# Patient Record
Sex: Female | Born: 1980 | Race: White | Hispanic: No | Marital: Married | Smoking: Current every day smoker
Health system: Southern US, Community
[De-identification: ages and names within clinical notes are randomized; demographics above are authoritative.]

## PROBLEM LIST (undated history)

## (undated) DIAGNOSIS — G40909 Epilepsy, unspecified, not intractable, without status epilepticus: Secondary | ICD-10-CM

## (undated) DIAGNOSIS — E079 Disorder of thyroid, unspecified: Secondary | ICD-10-CM

## (undated) HISTORY — PX: TUBAL LIGATION: SHX77

## (undated) HISTORY — DX: Disorder of thyroid, unspecified: E07.9

## (undated) HISTORY — DX: Epilepsy, unspecified, not intractable, without status epilepticus: G40.909

---
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
## 2001-07-01 ENCOUNTER — Emergency Department (HOSPITAL_COMMUNITY): Admission: EM | Admit: 2001-07-01 | Discharge: 2001-07-01 | Payer: Self-pay | Admitting: Emergency Medicine

## 2001-08-18 ENCOUNTER — Other Ambulatory Visit: Admission: RE | Admit: 2001-08-18 | Discharge: 2001-08-18 | Payer: Self-pay | Admitting: Obstetrics & Gynecology

## 2002-03-12 ENCOUNTER — Inpatient Hospital Stay (HOSPITAL_COMMUNITY): Admission: AD | Admit: 2002-03-12 | Discharge: 2002-03-16 | Payer: Self-pay | Admitting: Obstetrics and Gynecology

## 2002-08-02 ENCOUNTER — Emergency Department (HOSPITAL_COMMUNITY): Admission: EM | Admit: 2002-08-02 | Discharge: 2002-08-02 | Payer: Self-pay | Admitting: Emergency Medicine

## 2002-10-12 ENCOUNTER — Emergency Department (HOSPITAL_COMMUNITY): Admission: EM | Admit: 2002-10-12 | Discharge: 2002-10-12 | Payer: Self-pay | Admitting: Emergency Medicine

## 2003-01-31 ENCOUNTER — Other Ambulatory Visit: Admission: RE | Admit: 2003-01-31 | Discharge: 2003-01-31 | Payer: Self-pay | Admitting: Obstetrics and Gynecology

## 2003-07-12 ENCOUNTER — Ambulatory Visit (HOSPITAL_COMMUNITY): Admission: RE | Admit: 2003-07-12 | Discharge: 2003-07-12 | Payer: Self-pay | Admitting: Neurology

## 2004-02-17 ENCOUNTER — Other Ambulatory Visit: Admission: RE | Admit: 2004-02-17 | Discharge: 2004-02-17 | Payer: Self-pay | Admitting: Obstetrics and Gynecology

## 2004-09-28 ENCOUNTER — Inpatient Hospital Stay (HOSPITAL_COMMUNITY): Admission: AD | Admit: 2004-09-28 | Discharge: 2004-09-30 | Payer: Self-pay | Admitting: Obstetrics and Gynecology

## 2010-08-30 ENCOUNTER — Other Ambulatory Visit: Payer: Self-pay | Admitting: Family Medicine

## 2010-08-30 DIAGNOSIS — E041 Nontoxic single thyroid nodule: Secondary | ICD-10-CM

## 2010-09-11 ENCOUNTER — Other Ambulatory Visit (HOSPITAL_COMMUNITY)
Admission: RE | Admit: 2010-09-11 | Discharge: 2010-09-11 | Disposition: A | Discharge status: Home / Self Care | Payer: Self-pay | Source: Ambulatory Visit | Attending: Interventional Radiology | Admitting: Interventional Radiology

## 2010-09-11 ENCOUNTER — Ambulatory Visit
Admission: RE | Admit: 2010-09-11 | Discharge: 2010-09-11 | Disposition: A | Discharge status: Home / Self Care | Payer: No Typology Code available for payment source | Source: Ambulatory Visit | Attending: Family Medicine | Admitting: Family Medicine

## 2010-09-11 ENCOUNTER — Other Ambulatory Visit: Payer: Self-pay | Admitting: Interventional Radiology

## 2010-09-11 DIAGNOSIS — E041 Nontoxic single thyroid nodule: Secondary | ICD-10-CM

## 2010-09-11 DIAGNOSIS — E049 Nontoxic goiter, unspecified: Secondary | ICD-10-CM | POA: Insufficient documentation

## 2010-09-21 ENCOUNTER — Ambulatory Visit: Payer: Self-pay | Admitting: Endocrinology

## 2010-10-08 ENCOUNTER — Ambulatory Visit: Payer: Self-pay | Admitting: Endocrinology

## 2010-10-30 ENCOUNTER — Encounter: Payer: Self-pay | Admitting: Endocrinology

## 2010-10-30 ENCOUNTER — Ambulatory Visit (INDEPENDENT_AMBULATORY_CARE_PROVIDER_SITE_OTHER): Payer: Self-pay | Admitting: Endocrinology

## 2010-10-30 DIAGNOSIS — E042 Nontoxic multinodular goiter: Secondary | ICD-10-CM

## 2010-10-30 DIAGNOSIS — G40909 Epilepsy, unspecified, not intractable, without status epilepticus: Secondary | ICD-10-CM

## 2010-10-30 NOTE — Patient Instructions (Signed)
most of the time, a "lumpy thyroid" will eventually become overactive.  this is usually a slow process, happening over the span of many years. Please return here in approx 6 months.

## 2010-10-30 NOTE — Progress Notes (Signed)
  Subjective:    Patient ID: Caitlyn Gibson, female    DOB: 01-Sep-1980, 30 y.o.   MRN: [id number]  HPI Pt recently saw dr love for epilepsy.  He noted a goiter.  Pt had an ultrasound in feb, and then another in march, with bx then.  Pt says she notices the goiter, but it does not bother her. Past Medical History  Diagnosis Date  . Epilepsy    Past Surgical History  Procedure Date  . Appendectomy 1998    reports that she has been smoking.  She does not have any smokeless tobacco history on file. She reports that she does not drink alcohol or use illicit drugs. 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  Mother had resection of a goiter (benign). Allergies  Allergen Reactions  . Azithromycin     Review of Systems Denies sob and dysphagia.    Objective:   Physical Exam GENERAL: no distress head: no deformity eyes: no periorbital swelling, no proptosis external nose and ears are normal Neck:  The thyroid is 5x normal size on the right lobe, with a multinodular surface.  The left lobe is normal, except for a 2 cm lower pole nodule.     Labs: i reviewed the ultrasound and bx results.   Assessment & Plan:  Multinodular goiter, usually hereditary.  Although the [place] suggested growth, malignancy is very unlikely (fhx pos for benign goiter) Epilepsy.  As tsh is said to be normal, this is not related to #1.

## 2010-10-31 DIAGNOSIS — E042 Nontoxic multinodular goiter: Secondary | ICD-10-CM | POA: Insufficient documentation

## 2010-10-31 DIAGNOSIS — G40909 Epilepsy, unspecified, not intractable, without status epilepticus: Secondary | ICD-10-CM | POA: Insufficient documentation

## 2010-11-13 ENCOUNTER — Encounter: Payer: Self-pay | Admitting: Endocrinology

## 2012-09-29 ENCOUNTER — Telehealth: Payer: Self-pay | Admitting: *Deleted

## 2012-09-29 NOTE — Telephone Encounter (Signed)
200mg , 2am, 1 noon, 2pm, 150 tablet monthly supply.

## 2012-09-30 MED ORDER — CARBAMAZEPINE 200 MG PO TABS: 200.0000 mg | ORAL_TABLET | ORAL | Status: DC

## 2012-09-30 NOTE — Telephone Encounter (Signed)
I was out of the office yesterday.  Will send Rx now.  Former Love patient.  Dr Eulah Citizen

## 2012-10-15 ENCOUNTER — Ambulatory Visit (INDEPENDENT_AMBULATORY_CARE_PROVIDER_SITE_OTHER): Payer: Self-pay | Admitting: Neurology

## 2012-10-15 ENCOUNTER — Encounter: Payer: Self-pay | Admitting: Neurology

## 2012-10-15 VITALS — BP 98/61 | HR 69 | Ht 63.0 in | Wt 131.0 lb

## 2012-10-15 DIAGNOSIS — E079 Disorder of thyroid, unspecified: Secondary | ICD-10-CM

## 2012-10-15 MED ORDER — CARBAMAZEPINE 200 MG PO TABS: 200.0000 mg | ORAL_TABLET | ORAL | Status: DC

## 2012-10-16 NOTE — Progress Notes (Signed)
HPI:  Caitlyn Gibson is a 32- year-old right-handed white separated female with a history of simple partial seizures and secondarily generalization, previous patient of Dr. Sandria Manly, last visit was in May 2013.   Her seizure began in December of 1997.  She was first seen at age 32, 06/09/1996. She was the product of an 8 month pregnancy complicated by amniocentesis at 4 months of gestation. A chromosome analysis was unremarkable. At 6-1/2 months gestation there  was leaking from the placenta and she was born in hospital at  8 months gestation with a vertex presentation. She was early in motor developmental milestones. She did well in school, making A's and B's and never failed a grade. Her first seizure was a generalized major motor seizure 06/09/1996.   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 EEG 06/21/1996 showed  left temporal sharp waves with phase reversals at the T3 electrode. She was placed on Tegretol after a second generalized major motor seizure 06/22/1996 .She has not had a generalized major motor seizure since 1997.   Her last MRI study of the brain 07/12/2003 with and without contrast enhancement was normal.  In 2000 she quit taking her medicines correctly but then restarted after I discussed this with her. She had two successful pregnancies on carbamazepine or Tegretol. She had a tubal ligation  09/2004. I have asked her to take vitamin D and calcium but she never has. She has never had a DEXA scan because of costs. She has no insurance.   She has auras of seizures and simple partial seizures that occured once per month. She says that her head feels "crazy" and that she has an "empty" feeling in her  head. She hears a ticking sound that  goes away in 2 minutes. There is no dj vu, strange odors,or tastes.  She states that she takes her medication correctly. She is on carbamazepine 200 mg tablets, 2 twice daily and 100 mg at noon. Last level was 8.3 She still has those aura 1-2 times q 6 months.  She had a thyroid mass on exam and I referred her to Dr. [name].She underwent a thyroid ultrasound  that showed bilateral calcifications and left thyroid nodule biopsy, showing a goiter. She was to see Dr. [name],  She is on hold of her thyroid disease evaluation because of financial reasons,  Review of Systems  Enlarged thyroid PHYSICAL EXAMINATOINS:  Generalized: In no acute distress  Neck: Supple, no carotid bruits, enlarged firm thyroid  Cardiac: Regular rate rhythm  Pulmonary: Clear to auscultation bilaterally  Musculoskeletal: No deformity  Neurological examination  Mentation: Alert oriented to time, place, history taking, and causual conversation, depressed looking  Cranial nerve II-XII: Pupils were equal round reactive to light extraocular movements were full, visual field were full on confrontational test. facial sensation and strength were normal. hearing was intact to finger rubbing bilaterally. Uvula tongue midline.  head turning and shoulder shrug and were normal and symmetric.Tongue protrusion into cheek strength was normal.  Motor: normal tone, bulk and strength.  Sensory: Intact to fine touch, pinprick, preserved vibratory sensation, and proprioception at toes.  Coordination: Normal finger to nose, heel-to-shin bilaterally there was no truncal ataxia  Gait: Rising  up from seated position without assistance, normal stance, without trunk ataxia, moderate stride, good arm swing, smooth turning, able to perform tiptoe, and heel walking without difficulty.   Romberg signs: Negative  Deep tendon reflexes: Brachioradialis 2/2, biceps 2/2, triceps 2/2, patellar 2/2,  Achilles 2/2, plantar responses were flexor bilaterally.  Assessment and plan;  32 years old Caucasian female, with history of complex partial seizure  1 I refilled her medication, carbamazepine 200 mg 2 in the morning, 1 at noon, 2 at night,   because of financial reasons, she wants to hold off laboratory evaluation this time,  2. , return to clinic in one year

## 2012-11-20 ENCOUNTER — Telehealth: Payer: Self-pay | Admitting: *Deleted

## 2012-11-20 NOTE — Telephone Encounter (Signed)
Caitlyn Gibson called asking about refilling medication from one brand generic to another that pt was on originally (Epitol).  She had been taking the epitol then they were not able to get and changed to a taro brand.  This was for 1-2 months.  Now the epitol is back.   I gave the ok for this.  No change in dosing.  Contact is Caitlyn Gibson  [phone] at the [facility], [address] in [place], [place].

## 2012-11-20 NOTE — Telephone Encounter (Signed)
Agree above. 

## 2013-04-21 DIAGNOSIS — Z0289 Encounter for other administrative examinations: Secondary | ICD-10-CM

## 2013-05-05 ENCOUNTER — Telehealth: Payer: Self-pay

## 2013-05-05 NOTE — Telephone Encounter (Signed)
I called and left patient a VM that I am faxing out the [facility] forms now. I will also send her a copy.

## 2013-06-04 ENCOUNTER — Emergency Department: Payer: Self-pay | Admitting: Emergency Medicine

## 2013-08-04 ENCOUNTER — Telehealth: Payer: Self-pay | Admitting: Neurology

## 2013-08-04 ENCOUNTER — Encounter: Payer: Self-pay | Admitting: Neurology

## 2013-08-04 NOTE — Telephone Encounter (Signed)
Left message for patient about rescheduling 10/19/13 to 11/02/13 per Dr. Zannie CoveYan's schedule, printed and sent letter.

## 2013-10-19 ENCOUNTER — Other Ambulatory Visit: Payer: Self-pay | Admitting: Neurology

## 2013-10-19 ENCOUNTER — Ambulatory Visit: Payer: Self-pay | Admitting: Neurology

## 2013-10-19 MED ORDER — CARBAMAZEPINE 200 MG PO TABS: ORAL_TABLET | ORAL | Status: DC

## 2013-11-02 ENCOUNTER — Ambulatory Visit (INDEPENDENT_AMBULATORY_CARE_PROVIDER_SITE_OTHER): Payer: Self-pay | Admitting: Neurology

## 2013-11-02 ENCOUNTER — Telehealth: Payer: Self-pay | Admitting: Neurology

## 2013-11-02 ENCOUNTER — Encounter (INDEPENDENT_AMBULATORY_CARE_PROVIDER_SITE_OTHER): Payer: Self-pay

## 2013-11-02 ENCOUNTER — Encounter: Payer: Self-pay | Admitting: Neurology

## 2013-11-02 VITALS — BP 117/76 | HR 90 | Ht 63.0 in | Wt 120.0 lb

## 2013-11-02 DIAGNOSIS — E079 Disorder of thyroid, unspecified: Secondary | ICD-10-CM

## 2013-11-02 DIAGNOSIS — G40909 Epilepsy, unspecified, not intractable, without status epilepticus: Secondary | ICD-10-CM

## 2013-11-02 DIAGNOSIS — E042 Nontoxic multinodular goiter: Secondary | ICD-10-CM

## 2013-11-02 MED ORDER — CARBAMAZEPINE 200 MG PO TABS: ORAL_TABLET | ORAL | Status: DC

## 2013-11-02 NOTE — Telephone Encounter (Signed)
I did some searching, and [facility] [address] in [place] can fill a 30 day supply for $11.31.  I called the patient to see if she was agreeable to getting her Rx at [facility].  She said she would gladly go there instead of [facility] due to the price.  Rx has been resent to [facility].

## 2013-11-02 NOTE — Telephone Encounter (Signed)
[name]:  Please helped Caitlyn Gibson to see if she can get Tegretol from pharmaceutical assistance program, used to be a forethought prescription, but is no longer on 4 dollar list, she is self-pay

## 2013-11-02 NOTE — Progress Notes (Signed)
HPI:   Caitlyn Gibson is a 33 - year-old right-handed white separated female with a history of simple partial seizures and secondarily generalization, previous patient of Dr. Sandria ManlyLove, last visit was in May 2013.   Her seizure began in December of 1997.  She was first seen at age 89, 06/09/1996. She was the product of an 8 month pregnancy complicated by amniocentesis at 4 months of gestation. A chromosome analysis was unremarkable. At 6-1/2 months gestation there  was leaking from the placenta and she was born in hospital at  8 months gestation with a vertex presentation. She was early in motor developmental milestones. She did well in school, making A's and B's and never failed a grade. Her first seizure was a generalized major motor seizure 06/09/1996.   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 EEG 06/21/1996 showed  left temporal sharp waves with phase reversals at the T3 electrode. She was placed on Tegretol was started after a second generalized major motor seizure 06/22/1996 .She has not had a generalized major motor seizure since 1997.   Her last MRI study of the brain 07/12/2003 with and without contrast enhancement was normal.  She had two successful pregnancies on carbamazepine or Tegretol. She had a tubal ligation  09/2004. I have asked her to take vitamin D and calcium but she never has. She has never had a DEXA scan because of costs. She has no insurance.   She has auras of seizures and simple partial seizures that occured once per month. She says that her head feels "crazy" and that she has an "empty" feeling in her head. She hears a ticking sound that  goes away in 2 minutes. There is no dj vu,  strange odors,or tastes.  She is on carbamazepine 200 mg tablets, 2 twice daily and 100 mg at noon. Last level was 8.3 She still has those aura 1-2 times q 6 months.  She had a thyroid mass on exam and was referred t oDr. Little.She underwent a thyroid ultrasound  that showed bilateral calcifications and left thyroid nodule biopsy, showing a goiter. She was to see Dr. Romero BellingSean  Ellison,  She is on hold of her thyroid disease evaluation because of financial reasons,  UPDATE April 28th 2015: She still aura, few  times each month, around her period, she heard tickling sound, lasting for few minutes, no LOC. But she has not hadngeneralized seizure since age 33, she is having trouble affording her Tegretol now, because is no longer on 4 dollar prescription list   Review of Systems  Enlarged thyroid  PHYSICAL EXAMINATOINS:  Generalized: In no acute distress  Neck: Supple, no carotid bruits, enlarged firm thyroid  Cardiac: Regular rate rhythm  Pulmonary: Clear to auscultation bilaterally  Musculoskeletal: No deformity  Neurological examination  Mentation: Alert oriented to time, place, history taking, and causual conversation, depressed looking  Cranial nerve II-XII: Pupils were equal round reactive to light extraocular movements were full, visual field were full on confrontational test. facial sensation and strength were normal. hearing was intact to finger rubbing bilaterally. Uvula tongue midline.  head turning and shoulder shrug and were normal and symmetric.Tongue protrusion into cheek strength was normal.  Motor: normal tone, bulk  and strength.  Sensory: Intact to fine touch, pinprick, preserved vibratory sensation, and proprioception at toes.  Coordination: Normal finger to nose, heel-to-shin bilaterally there was no truncal ataxia  Gait: Rising up from seated position without assistance, normal stance, without trunk ataxia, moderate stride, good arm swing, smooth turning, able to  perform tiptoe, and heel walking without difficulty.   Romberg signs: Negative  Deep tendon reflexes: Brachioradialis 2/2, biceps 2/2, triceps 2/2, patellar 2/2, Achilles 2/2, plantar responses were flexor bilaterally.  Assessment and plan;  33 years old Caucasian female, with history of complex partial seizure  1 I refilled her medication, carbamazepine 200 mg 2 in the morning,  2 at night,  Will try to help her get medicine assistance program, return to clinic in one year refill her medications

## 2014-05-08 HISTORY — PX: OTHER SURGICAL HISTORY: SHX169

## 2014-05-30 ENCOUNTER — Emergency Department: Payer: Self-pay | Admitting: Emergency Medicine

## 2014-06-01 ENCOUNTER — Encounter: Payer: Self-pay | Admitting: Neurology

## 2014-06-19 DIAGNOSIS — G8929 Other chronic pain: Secondary | ICD-10-CM | POA: Insufficient documentation

## 2014-06-24 DIAGNOSIS — M719 Bursopathy, unspecified: Secondary | ICD-10-CM

## 2014-06-24 DIAGNOSIS — M67919 Unspecified disorder of synovium and tendon, unspecified shoulder: Secondary | ICD-10-CM | POA: Insufficient documentation

## 2014-11-03 ENCOUNTER — Encounter: Payer: Self-pay | Admitting: Nurse Practitioner

## 2014-11-03 ENCOUNTER — Ambulatory Visit (INDEPENDENT_AMBULATORY_CARE_PROVIDER_SITE_OTHER): Payer: Self-pay | Admitting: Nurse Practitioner

## 2014-11-03 ENCOUNTER — Ambulatory Visit: Payer: Self-pay | Admitting: Nurse Practitioner

## 2014-11-03 VITALS — BP 111/60 | HR 79 | Ht 64.5 in | Wt 134.6 lb

## 2014-11-03 DIAGNOSIS — G40909 Epilepsy, unspecified, not intractable, without status epilepticus: Secondary | ICD-10-CM

## 2014-11-03 DIAGNOSIS — N390 Urinary tract infection, site not specified: Secondary | ICD-10-CM | POA: Insufficient documentation

## 2014-11-03 MED ORDER — CARBAMAZEPINE 200 MG PO TABS: ORAL_TABLET | ORAL | Status: DC

## 2014-11-03 NOTE — Patient Instructions (Signed)
Continue carbamazepine 200 in the morning and 200 at night, will refill for one year Given co pay card Follow-up yearly and when necessary

## 2014-11-03 NOTE — Progress Notes (Signed)
[facility]  PATIENT: Caitlyn Gibson DOB: 1981/03/16   REASON FOR VISIT: Follow-up for seizure disorder  HISTORY FROM: Patient    HISTORY OF PRESENT ILLNESS:Caitlyn Gibson is a 34- year-old right-handed  female with a history of simple partial seizures and secondarily generalization, previous patient of Dr. Sandria Manly, last visit was 11/02/13 with Dr. Terrace Arabia. She still has aura, few times each month, around her period, she heard tickling sound, lasting for few minutes, no LOC. But she has not had a generalized seizure since age 64, she continues taking carbamazepine 200 mg 2 in the morning and 2 at night. She has no medical insurance or drug coverage. She returns for reevaluation  HISTORY: Her seizure began in December of 1997. She was first seen at age 67, 06/09/1996. She was the product of an 8 month pregnancy complicated by amniocentesis at 4 months of gestation. A chromosome analysis was unremarkable. At 6-1/2 months gestation there was leaking from the placenta and she was born in hospital at 8 months gestation with a vertex presentation. She was early in motor developmental milestones. She did well in school, making A's and B's and never failed a grade. Her first seizure was a generalized major motor seizure 06/09/1996.  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 EEG 06/21/1996 showed left temporal sharp waves with phase reversals at the T3 electrode. She was placed on Tegretol was started after a second generalized major motor seizure 06/22/1996 .She has not had a generalized major motor seizure since 1997.  Her last MRI study of the brain 07/12/2003 with and without contrast enhancement  was normal. She had two successful pregnancies on carbamazepine or Tegretol. She had a tubal ligation 09/2004. I have asked her to take vitamin D and calcium but she never has. She has never had a DEXA scan because of costs. She has no insurance.  She has auras of seizures and simple partial seizures that occured once per month. She says that her head feels "crazy" and that she has an "empty" feeling in her head. She hears a ticking sound that goes away in 2 minutes. There is no dj vu, strange odors,or tastes. She is on carbamazepine 200 mg tablets, 2 twice daily and 100 mg at noon. Last level was 8.3 She still has those aura 1-2 times q 6 months. She had a thyroid mass on exam and was referred t oDr. Little.She underwent a thyroid ultrasound that showed bilateral calcifications and left thyroid nodule biopsy, showing a goiter. She was to see Dr. [name], She is on hold of her thyroid disease evaluation because of financial reasons,    REVIEW OF SYSTEMS: Full 14 system review of systems performed and notable only for those listed, all others are neg:  Constitutional: neg  Cardiovascular: neg Ear/Nose/Throat: neg  Skin: neg Eyes: neg Respiratory: neg Gastroitestinal: neg  Hematology/Lymphatic: neg  Endocrine: Goiter Musculoskeletal: Torn rotator cuff Allergy/Immunology: neg Neurological: Headache Psychiatric: neg Sleep : neg   ALLERGIES: Allergies  Allergen Reactions  . Azithromycin   . Erythromycin     Cannot take with tegretol    HOME MEDICATIONS: Outpatient Prescriptions Prior to Visit  Medication Sig Dispense Refill  . carbamazepine (TEGRETOL) 200 MG tablet  Take 2 tabs twice a day 120 tablet 12   No facility-administered medications prior to visit.    PAST MEDICAL HISTORY: Past Medical History  Diagnosis Date  . Epilepsy   . Thyroid mass     PAST SURGICAL HISTORY: Past Surgical History  Procedure Laterality Date  . Appendectomy  1998  . Tubal ligation     . Rotator cuff tear Left 05/2014    ongoing for year before    FAMILY HISTORY: Family History  Problem Relation Age of Onset  . Cancer Other   . Cancer Other     Ovarian Cancer-Grandmother  . Diabetes Maternal Uncle     SOCIAL HISTORY: History   Social History  . Marital Status: Married    Spouse Name: Sam  . Number of Children: 2  . Years of Education: N/A   Occupational History  . Waitress    Social History Main Topics  . Smoking status: Current Every Day Smoker -- 1.00 packs/day    Types: Cigarettes  . Smokeless tobacco: Never Used  . Alcohol Use: No  . Drug Use: No  . Sexual Activity: Not on file   Other Topics Concern  . Not on file   Social History Narrative   Pt does not get regular exercise.   Lives with husband, 2 kids           PHYSICAL EXAM  Filed Vitals:   11/03/14 0918  BP: 111/60  Pulse: 79  Height: 5' 4.5" (1.638 m)  Weight: 134 lb 9.6 oz (61.054 kg)   Body mass index is 22.76 kg/(m^2). Generalized: In no acute distress Neck: Supple, no carotid bruits, enlarged firm thyroid Musculoskeletal: No deformity, decreased range of motion to left shoulder  Neurological examination  Mentation: Alert oriented to time, place, history taking, and causual conversation,  Cranial nerve II-XII: Pupils were equal round reactive to light extraocular movements were full, visual field were full on confrontational test. facial sensation and strength were normal. hearing was intact to finger rubbing bilaterally. Uvula tongue midline. head turning and shoulder shrug and were normal and symmetric.Tongue protrusion into cheek strength was normal. Motor: normal tone, bulk and strength. Coordination: Normal finger to nose, heel-to-shin bilaterally  Gait: Rising up from seated position without assistance, normal stance, without trunk ataxia, moderate stride, good arm swing, smooth turning, able to perform tiptoe, and heel walking without difficulty.  Deep  tendon reflexes: Brachioradialis 2/2, biceps 2/2, triceps 2/2, patellar 2/2, Achilles 2/2, plantar responses were flexor bilaterally.  DIAGNOSTIC DATA (LABS, IMAGING, TESTING) - ASSESSMENT AND PLAN  34 y.o. year old female  has a past medical history of Epilepsy and Thyroid mass. here to follow-up.  Continue carbamazepine 200 in the morning and 200 at night, will refill for one year Given co pay card, patient has no insurance Follow-up yearly and when necessary Nilda RiggsNancy Carolyn Eaden Hettinger, [facility] LLCGNP, [facility] Healthcare[facility], APRN  [facility] [address], [address] [place], [place] [address] [phone]

## 2014-11-04 NOTE — Progress Notes (Signed)
I have reviewed and agreed above plan. 

## 2014-11-22 ENCOUNTER — Other Ambulatory Visit: Payer: Self-pay | Admitting: Neurology

## 2014-11-22 NOTE — Telephone Encounter (Signed)
Duplicate Rx sent at [facility]

## 2015-05-28 ENCOUNTER — Emergency Department: Payer: Self-pay

## 2015-05-28 ENCOUNTER — Emergency Department
Admission: EM | Admit: 2015-05-28 | Discharge: 2015-05-28 | Disposition: A | Discharge status: Home / Self Care | Payer: Self-pay | Attending: Emergency Medicine | Admitting: Emergency Medicine

## 2015-05-28 ENCOUNTER — Encounter: Payer: Self-pay | Admitting: Emergency Medicine

## 2015-05-28 DIAGNOSIS — F17210 Nicotine dependence, cigarettes, uncomplicated: Secondary | ICD-10-CM | POA: Insufficient documentation

## 2015-05-28 DIAGNOSIS — J029 Acute pharyngitis, unspecified: Secondary | ICD-10-CM | POA: Insufficient documentation

## 2015-05-28 DIAGNOSIS — Z79899 Other long term (current) drug therapy: Secondary | ICD-10-CM | POA: Insufficient documentation

## 2015-05-28 LAB — BASIC METABOLIC PANEL
Anion gap: 5 (ref 5–15)
BUN: 9 mg/dL (ref 6–20)
CALCIUM: 8.7 mg/dL — AB (ref 8.9–10.3)
CHLORIDE: 111 mmol/L (ref 101–111)
CO2: 24 mmol/L (ref 22–32)
CREATININE: 0.53 mg/dL (ref 0.44–1.00)
GFR calc non Af Amer: >60 mL/min (ref >60)
Glucose, Bld: 78 mg/dL (ref 65–99)
Potassium: 3.9 mmol/L (ref 3.5–5.1)
SODIUM: 140 mmol/L (ref 135–145)

## 2015-05-28 LAB — CBC WITH DIFFERENTIAL/PLATELET
BASOS PCT: 1 %
Basophils Absolute: 0.1 10*3/uL (ref 0–0.1)
EOS ABS: 0.4 10*3/uL (ref 0–0.7)
EOS PCT: 3 %
HCT: 43.3 % (ref 35.0–47.0)
HEMOGLOBIN: 14.4 g/dL (ref 12.0–16.0)
LYMPHS ABS: 2.2 10*3/uL (ref 1.0–3.6)
Lymphocytes Relative: 16 %
MCH: 34.9 pg — AB (ref 26.0–34.0)
MCHC: 33.3 g/dL (ref 32.0–36.0)
MCV: 104.8 fL — ABNORMAL HIGH (ref 80.0–100.0)
MONO ABS: 0.9 10*3/uL (ref 0.2–0.9)
MONOS PCT: 7 %
NEUTROS PCT: 73 %
Neutro Abs: 10.1 10*3/uL — ABNORMAL HIGH (ref 1.4–6.5)
PLATELETS: 259 10*3/uL (ref 150–440)
RBC: 4.13 MIL/uL (ref 3.80–5.20)
RDW: 11.3 % — AB (ref 11.5–14.5)
WBC: 13.7 10*3/uL — ABNORMAL HIGH (ref 3.6–11.0)

## 2015-05-28 LAB — POCT RAPID STREP A: STREPTOCOCCUS, GROUP A SCREEN (DIRECT): NEGATIVE

## 2015-05-28 LAB — TSH: TSH: 0.907 u[IU]/mL (ref 0.350–4.500)

## 2015-05-28 MED ORDER — IOHEXOL 300 MG/ML  SOLN
75.0000 mL | Freq: Once | INTRAMUSCULAR | Status: AC | PRN
  Administered 2015-05-28: 75 mL via INTRAVENOUS
  Filled 2015-05-28: qty 75

## 2015-05-28 MED ORDER — AMOXICILLIN 500 MG PO TABS: 500.0000 mg | ORAL_TABLET | Freq: Two times a day (BID) | ORAL | Status: DC

## 2015-05-28 NOTE — ED Provider Notes (Signed)
[facility] Emergency Department Provider Note  ____________________________________________  Time seen: Approximately 10:10 AM  I have reviewed the triage vital signs and the nursing notes.   HISTORY  Chief Complaint Sore Throat    HPI Caitlyn Gibson is a 34 y.o. female who presents for evaluation extremely sore throat since Tuesday of this past week which is approximately 6 days ago. Patient states that the hurts to swallow in addition she's got a low-grade fever cough congestion.Past medical history significant for a thyroid mass and goiter however she has not been able to follow up with this for several years and feels like it's progressively getting bigger.   Past Medical History  Diagnosis Date  . Epilepsy (HCC)   . Thyroid mass     Patient Active Problem List   Diagnosis Date Noted  . Frequent UTI 11/03/2014  . 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 Thyroid mass   . 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 Epilepsy (HCC) 10/31/2010    Past Surgical History  Procedure Laterality Date  . Appendectomy  1998  . Tubal ligation    . Rotator cuff tear Left 05/2014    ongoing for year before    Current Outpatient Rx  Name  Route  Sig  Dispense  Refill  . amoxicillin (AMOXIL) 500 MG tablet   Oral   Take 1 tablet (500 mg total) by mouth 2 (two) times daily.   20 tablet   0   . carbamazepine (TEGRETOL) 200 MG tablet      TAKE TWO TABLETS BY MOUTH TWICE DAILY   120 tablet   11     Allergies Azithromycin and Erythromycin  Family History  Problem Relation Age of Onset  . Cancer Other   . Cancer Other     Ovarian Cancer-Grandmother  . Diabetes Maternal Uncle     Social History Social History  Substance Use Topics  . Smoking status: Current Every Day Smoker -- 1.00 packs/day    Types: Cigarettes  . Smokeless tobacco: Never Used  . Alcohol Use: No    Review of Systems Constitutional: Occasional  fever/chills Eyes: No visual changes. ENT: Positive sore throat with thyroid mass. Cardiovascular: Denies chest pain. Respiratory: Denies shortness of breath. Positive for cough Gastrointestinal: No abdominal pain.  No nausea, no vomiting.  No diarrhea.  No constipation. Genitourinary: Negative for dysuria. Musculoskeletal: Negative for back pain. Skin: Negative for rash. Neurological: Negative for headaches, focal weakness or numbness.  10-point ROS otherwise negative.  ____________________________________________   PHYSICAL EXAM:  VITAL SIGNS: ED Triage Vitals  Enc Vitals Group     BP 05/28/15 0949 110/76 mmHg     Pulse Rate 05/28/15 0949 91     Resp 05/28/15 0949 18     Temp 05/28/15 0949 98.1 F (36.7 C)     Temp Source 05/28/15 0949 Oral     SpO2 05/28/15 0949 100 %     Weight 05/28/15 0949 130 lb (58.968 kg)     Height 05/28/15 0949  (1.626 m)     Head Cir --      Peak Flow --      Pain Score 05/28/15 0947 7     Pain Loc --      Pain Edu? --      Excl. in GC? --     Constitutional: Alert and oriented. Well appearing and in no acute distress. Eyes: Conjunctivae are normal. PERRL. EOMI. Head: Atraumatic. Nose:  No congestion/rhinnorhea. Mouth/Throat: Mucous membranes are moist.  Oropharynx is very erythematous. Neck: No stridor.  No cervical spinal tenderness to palpation, however there is a large goiter noted within the soft tissue of the neck greater on the right than on the left. Cardiovascular: Normal rate, regular rhythm. Grossly normal heart sounds.  Good peripheral circulation. Respiratory: Normal respiratory effort.  No retractions. Lungs CTAB. Gastrointestinal: Soft and nontender. No distention. No abdominal bruits. No CVA tenderness. Musculoskeletal: No lower extremity tenderness nor edema.  No joint effusions. Neurologic:  Normal speech and language. No gross focal neurologic deficits are appreciated. No gait instability. Skin:  Skin is warm, dry  and intact. No rash noted. Psychiatric: Mood and affect are normal. Speech and behavior are normal.  ____________________________________________   LABS (all labs ordered are listed, but only abnormal results are displayed)  Labs Reviewed  BASIC METABOLIC PANEL - Abnormal; Notable for the following:    Calcium 8.7 (*)    All other components within normal limits  CBC WITH DIFFERENTIAL/PLATELET - Abnormal; Notable for the following:    WBC 13.7 (*)    MCV 104.8 (*)    MCH 34.9 (*)    RDW 11.3 (*)    Neutro Abs 10.1 (*)    All other components within normal limits  CULTURE, GROUP A STREP ([facility] ONLY)  TSH  POCT RAPID STREP A   ____________________________________________  EKG Not applicable  RADIOLOGY  IMPRESSION: 1. Symmetric mild diffuse prominence of the posterior nasopharyngeal and posterior or pharyngeal mucosal soft tissues with no discrete pharyngeal mass, nonspecific, likely reactive. No retropharyngeal or peritonsillar fluid collections. If the patient's symptoms resolve with medical therapy, no further follow-up is required. If the patient's throat symptoms persist despite medical therapy, ENT consultation is advised. 2. Multinodular goiter as described, with a dominant partially calcified 2.7 cm left thyroid lobe nodule. Advise correlation with thyroid ultrasound. ____________________________________________   PROCEDURES  Procedure(s) performed: None  Critical Care performed: No  ____________________________________________   INITIAL IMPRESSION / ASSESSMENT AND PLAN / ED COURSE  Pertinent labs & imaging results that were available during my care of the patient were reviewed by me and considered in my medical decision making (see chart for details).  Acute pharyngitis with increased nasopharyngeal posterior pharyngeal mucosal soft tissues. Acute URI. Rx given for amoxicillin secondary to seizure disorder unable take any macrolides.. Patient follow-up  with PCP or ENT if symptoms worsen or don't resolve. ____________________________________________   FINAL CLINICAL IMPRESSION(S) / ED DIAGNOSES  Final diagnoses:  Acute pharyngitis, unspecified etiology      Evangeline DakinCharles M Brehanna Deveny, PA-C 05/28/15 1309  Governor Rooksebecca Lord, MD 05/28/15 1515

## 2015-05-28 NOTE — Discharge Instructions (Signed)
Pharyngitis Purchase chlorpheniramine tablets over-the-counter as needed for runny nose and drainage. Pharyngitis is redness, pain, and swelling (inflammation) of your pharynx.  CAUSES  Pharyngitis is usually caused by infection. Most of the time, these infections are from viruses (viral) and are part of a cold. However, sometimes pharyngitis is caused by bacteria (bacterial). Pharyngitis can also be caused by allergies. Viral pharyngitis may be spread from person to person by coughing, sneezing, and personal items or utensils (cups, forks, spoons, toothbrushes). Bacterial pharyngitis may be spread from person to person by more intimate contact, such as kissing.  SIGNS AND SYMPTOMS  Symptoms of pharyngitis include:   Sore throat.   Tiredness (fatigue).   Low-grade fever.   Headache.  Joint pain and muscle aches.  Skin rashes.  Swollen lymph nodes.  Plaque-like film on throat or tonsils (often seen with bacterial pharyngitis). DIAGNOSIS  Your health care provider will ask you questions about your illness and your symptoms. Your medical history, along with a physical exam, is often all that is needed to diagnose pharyngitis. Sometimes, a rapid strep test is done. Other lab tests may also be done, depending on the suspected cause.  TREATMENT  Viral pharyngitis will usually get better in 3-4 days without the use of medicine. Bacterial pharyngitis is treated with medicines that kill germs (antibiotics).  HOME CARE INSTRUCTIONS   Drink enough water and fluids to keep your urine clear or pale yellow.   Only take over-the-counter or prescription medicines as directed by your health care provider:   If you are prescribed antibiotics, make sure you finish them even if you start to feel better.   Do not take aspirin.   Get lots of rest.   Gargle with 8 oz of salt water ( tsp of salt per 1 qt of water) as often as every 1-2 hours to soothe your throat.   Throat lozenges (if  you are not at risk for choking) or sprays may be used to soothe your throat. SEEK MEDICAL CARE IF:   You have large, tender lumps in your neck.  You have a rash.  You cough up green, yellow-brown, or bloody spit. SEEK IMMEDIATE MEDICAL CARE IF:   Your neck becomes stiff.  You drool or are unable to swallow liquids.  You vomit or are unable to keep medicines or liquids down.  You have severe pain that does not go away with the use of recommended medicines.  You have trouble breathing (not caused by a stuffy nose). MAKE SURE YOU:   Understand these instructions.  Will watch your condition.  Will get help right away if you are not doing well or get worse.   This information is not intended to replace advice given to you by your health care provider. Make sure you discuss any questions you have with your health care provider.   Document Released: 06/24/2005 Document Revised: 04/14/2013 Document Reviewed: 03/01/2013 Elsevier Interactive Patient Education [employer]2016 Elsevier Inc.

## 2015-05-28 NOTE — ED Notes (Signed)
Sore throat since Tuesday  Pain is worse today

## 2015-05-30 LAB — CULTURE, GROUP A STREP (THRC)

## 2015-08-21 ENCOUNTER — Ambulatory Visit (INDEPENDENT_AMBULATORY_CARE_PROVIDER_SITE_OTHER): Payer: Self-pay | Admitting: Nurse Practitioner

## 2015-08-21 ENCOUNTER — Encounter: Payer: Self-pay | Admitting: Nurse Practitioner

## 2015-08-21 VITALS — BP 130/87 | HR 86 | Ht 64.0 in | Wt 125.6 lb

## 2015-08-21 DIAGNOSIS — G40909 Epilepsy, unspecified, not intractable, without status epilepticus: Secondary | ICD-10-CM

## 2015-08-21 DIAGNOSIS — Z5181 Encounter for therapeutic drug level monitoring: Secondary | ICD-10-CM

## 2015-08-21 MED ORDER — CARBAMAZEPINE 200 MG PO TABS: 400.0000 mg | ORAL_TABLET | Freq: Two times a day (BID) | ORAL | Status: DC

## 2015-08-21 NOTE — Patient Instructions (Addendum)
Continue carbamazepine 200-mg twice daily Will check labs today for DMV carbamazepine level Call for any seizure activity Follow-up yearly and when necessary

## 2015-08-21 NOTE — Progress Notes (Signed)
[facility]  PATIENT: Caitlyn Gibson DOB: [birth year]   REASON FOR VISIT: Follow-up for seizure disorder HISTORY FROM: Patient    HISTORY OF PRESENT ILLNESS:Rayma is a 35- year-old right-handed female with a history of simple partial seizures and secondarily generalization, previous patient of Dr. Sandria Manly, last visit was 11/03/14. She still has aura, few times each month, around her period, she heard tickling sound, lasting for few minutes, no LOC. But she has not had a generalized seizure since age 62, she continues taking carbamazepine 200 mg 2 in the morning and 2 at night. She has no medical insurance or drug coverage. She returns for reevaluation  HISTORY: Her seizure began in December of 1997. She was first seen at age 57, 06/09/1996. She was the product of an 8 month pregnancy complicated by amniocentesis at 4 months of gestation. A chromosome analysis was unremarkable. At 6-1/2 months gestation there was leaking from the placenta and she was born in hospital at 8 months gestation with a vertex presentation. She was early in motor developmental milestones. She did well in school, making A's and B's and never failed a grade. Her first seizure was a generalized major motor seizure 06/09/1996.  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 EEG 06/21/1996 showed left temporal sharp waves with phase reversals at the T3 electrode. She was placed on Tegretol was started after a second generalized major motor seizure 06/22/1996 .She has not had a generalized major motor seizure since 1997.  Her last MRI study of the brain 07/12/2003 with and without contrast enhancement was  normal. She had two successful pregnancies on carbamazepine or Tegretol. She had a tubal ligation 09/2004. I have asked her to take vitamin D and calcium but she never has. She has never had a DEXA scan because of costs. She has no insurance.  She has auras of seizures and simple partial seizures that occured once per month. She says that her head feels "crazy" and that she has an "empty" feeling in her head. She hears a ticking sound that goes away in 2 minutes. There is no dj vu, strange odors,or tastes. She is on carbamazepine 200 mg tablets, 2 twice daily and 100 mg at noon. Last level was 8.3 She still has those aura 1-2 times q 6 months. She had a thyroid mass on exam and was referred t oDr. Little.She underwent a thyroid ultrasound that showed bilateral calcifications and left thyroid nodule biopsy, showing a goiter. She was to see Dr. [name], She is on hold of her thyroid disease evaluation because of financial reasons   REVIEW OF SYSTEMS: Full 14 system review of systems performed and notable only for those listed, all others are neg:  Constitutional: neg  Cardiovascular: neg Ear/Nose/Throat: neg  Skin: neg Eyes: neg Respiratory: neg Gastroitestinal: neg  Hematology/Lymphatic: neg  Endocrine: neg Musculoskeletal:neg Allergy/Immunology: neg Neurological: neg Psychiatric: neg Sleep : neg   ALLERGIES: Allergies  Allergen Reactions  . Azithromycin   . Erythromycin     Cannot take with tegretol    HOME MEDICATIONS: Outpatient Prescriptions Prior to Visit  Medication Sig Dispense Refill  . carbamazepine (TEGRETOL) 200 MG tablet TAKE TWO TABLETS BY MOUTH TWICE DAILY 120 tablet  11  . amoxicillin (AMOXIL) 500 MG tablet Take 1 tablet (500 mg total) by mouth 2 (two) times daily. (Patient not taking: Reported on 08/21/2015) 20 tablet 0   No facility-administered medications prior to visit.    PAST MEDICAL HISTORY: Past Medical History  Diagnosis Date  . Epilepsy  (HCC)   . Thyroid mass     PAST SURGICAL HISTORY: Past Surgical History  Procedure Laterality Date  . Appendectomy  1998  . Tubal ligation    . Rotator cuff tear Left 05/2014    ongoing for year before    FAMILY HISTORY: Family History  Problem Relation Age of Onset  . Cancer Other   . Cancer Other     Ovarian Cancer-Grandmother  . Diabetes Maternal Uncle     SOCIAL HISTORY: Social History   Social History  . Marital Status: Married    Spouse Name: N/A  . Number of Children: 2  . Years of Education: N/A   Occupational History  . Waitress    Social History Main Topics  . Smoking status: Current Every Day Smoker -- 1.00 packs/day    Types: Cigarettes  . Smokeless tobacco: Never Used  . Alcohol Use: No  . Drug Use: No  . Sexual Activity: Not on file   Other Topics Concern  . Not on file   Social History Narrative   Pt does not get regular exercise.   Lives with husband, 2 kids           PHYSICAL EXAM  Filed Vitals:   08/21/15 1619  BP: 130/87  Pulse: 86  Height: 5\' 4"  (1.626 m)  Weight: 125 lb 9.6 oz (56.972 kg)   Body mass index is 21.55 kg/(m^2). Generalized: In no acute distress Neck: Supple, no carotid bruits,  Musculoskeletal: No deformity, decreased range of motion to left shoulder  Neurological examination  Mentation: Alert oriented to time, place, history taking, and causual conversation,  Cranial nerve II-XII: Pupils were equal round reactive to light extraocular movements were full, visual field were full on confrontational test. facial sensation and strength were normal. hearing was intact to finger rubbing bilaterally. Uvula tongue midline. head turning and shoulder shrug and were normal and symmetric.Tongue protrusion into cheek strength was normal. Motor: normal tone, bulk and strength. Coordination: Normal finger to nose, heel-to-shin bilaterally  Gait: Rising up from seated position without assistance, normal stance, without  trunk ataxia, moderate stride, good arm swing, smooth turning, able to perform tiptoe, and heel walking without difficulty.  Deep tendon reflexes: Brachioradialis 2/2, biceps 2/2, triceps 2/2, patellar 2/2, Achilles 2/2, plantar responses were flexor bilaterally.   DIAGNOSTIC DATA (LABS, IMAGING, TESTING) - I reviewed patient records, labs, notes, testing and imaging myself where available.  Lab Results  Component Value Date   WBC 13.7* 05/28/2015   HGB 14.4 05/28/2015   HCT 43.3 05/28/2015   MCV 104.8* 05/28/2015   PLT 259 05/28/2015      Component Value Date/Time   NA 140 05/28/2015 1028   K 3.9 05/28/2015 1028   CL 111 05/28/2015 1028   CO2 24 05/28/2015 1028   GLUCOSE 78 05/28/2015 1028   BUN 9 05/28/2015 1028   CREATININE 0.53 05/28/2015 1028   CALCIUM 8.7* 05/28/2015 1028   GFRNONAA >60 05/28/2015 1028   GFRAA >60 05/28/2015 1028    Lab Results  Component Value Date   TSH 0.907 05/28/2015      ASSESSMENT AND PLAN  35 y.o. year old female  has a  past medical history of Epilepsy (HCC) . here to follow-up  Continue carbamazepine 200-mg (2)twice daily will refill Will check labs today for [facility] Call for any seizure activity Follow-up yearly and when necessary Nilda Riggs, [facility], [facility], APRN  [facility] [address], [address] [place], [place] [address] [phone]

## 2015-08-22 ENCOUNTER — Telehealth: Payer: Self-pay | Admitting: *Deleted

## 2015-08-22 LAB — CARBAMAZEPINE LEVEL, TOTAL: Carbamazepine (Tegretol), S: 10.7 ug/mL (ref 4.0–12.0)

## 2015-08-22 NOTE — Progress Notes (Signed)
I have reviewed and agreed above plan. 

## 2015-08-22 NOTE — Telephone Encounter (Signed)
Spoke to pt and relayed the labs looked good.  She verbalized understanding.

## 2015-08-22 NOTE — Telephone Encounter (Signed)
-----   Message from Nilda Riggs, NP sent at 08/22/2015  7:54 AM EST ----- Labs look good. Please call patient

## 2015-09-25 DIAGNOSIS — Z0289 Encounter for other administrative examinations: Secondary | ICD-10-CM

## 2015-10-05 ENCOUNTER — Telehealth: Payer: Self-pay | Admitting: Nurse Practitioner

## 2015-10-05 NOTE — Telephone Encounter (Signed)
Patient has paid payment for her form.

## 2015-10-11 NOTE — Telephone Encounter (Signed)
To MR. 

## 2015-10-11 NOTE — Telephone Encounter (Signed)
DMV form completed, sent to CM for review and signature.

## 2015-10-12 ENCOUNTER — Telehealth: Payer: Self-pay | Admitting: Nurse Practitioner

## 2015-10-12 NOTE — Telephone Encounter (Signed)
Faxed form to [place] [phone].

## 2015-11-03 ENCOUNTER — Ambulatory Visit: Payer: Self-pay | Admitting: Nurse Practitioner

## 2016-05-27 ENCOUNTER — Encounter: Payer: Self-pay | Admitting: Nurse Practitioner

## 2016-08-20 ENCOUNTER — Ambulatory Visit: Payer: Self-pay | Admitting: Nurse Practitioner

## 2016-08-27 ENCOUNTER — Other Ambulatory Visit: Payer: Self-pay | Admitting: Nurse Practitioner

## 2016-09-16 ENCOUNTER — Telehealth: Payer: Self-pay

## 2016-09-16 ENCOUNTER — Ambulatory Visit: Payer: Self-pay | Admitting: Nurse Practitioner

## 2016-09-16 NOTE — Telephone Encounter (Signed)
Called pt and r/s this afternoon's appt to 10/22/16 d/t weather.

## 2016-10-22 ENCOUNTER — Telehealth: Payer: Self-pay | Admitting: *Deleted

## 2016-10-22 ENCOUNTER — Ambulatory Visit: Payer: Self-pay | Admitting: Nurse Practitioner

## 2016-10-22 NOTE — Telephone Encounter (Signed)
LVM informing patient office is closed today due to power outage. Advised she call tomorrow or later this week to reschedule her FU with C Daphine Deutscher, NPand left office number.

## 2016-10-24 ENCOUNTER — Other Ambulatory Visit: Payer: Self-pay | Admitting: Neurology

## 2016-11-18 ENCOUNTER — Telehealth: Payer: Self-pay | Admitting: Neurology

## 2016-11-18 MED ORDER — CARBAMAZEPINE 200 MG PO TABS
ORAL_TABLET | ORAL | 0 refills | Status: DC
Start: 2016-11-18 — End: 2016-12-09

## 2016-11-18 NOTE — Telephone Encounter (Addendum)
Patient last seen 08/2015.  She had her yearly follow up scheduled but it was canceled by our office due to our power outage.  She has a pending appt on 12/09/16 that she will need to keep for continued refills.  30-day supply sent to pharmacy to last until her follow up.

## 2016-11-18 NOTE — Telephone Encounter (Signed)
Pt calling for refill of  carbamazepine (TEGRETOL) 200 MG tablet   [id number] [place] [facility] [place] [place] - [address] [phone] (Phone) [phone] (Fax)

## 2016-11-18 NOTE — Addendum Note (Signed)
Addended by: Lindell SparKIRKMAN, MICHELLE C on: 11/18/2016 03:07 PM   Modules accepted: Orders

## 2016-12-09 ENCOUNTER — Ambulatory Visit (INDEPENDENT_AMBULATORY_CARE_PROVIDER_SITE_OTHER): Payer: Self-pay | Admitting: Neurology

## 2016-12-09 ENCOUNTER — Encounter: Payer: Self-pay | Admitting: Neurology

## 2016-12-09 VITALS — BP 112/70 | HR 76 | Ht 64.0 in | Wt 122.5 lb

## 2016-12-09 DIAGNOSIS — G40909 Epilepsy, unspecified, not intractable, without status epilepticus: Secondary | ICD-10-CM

## 2016-12-09 MED ORDER — OXCARBAZEPINE 300 MG PO TABS: 300.0000 mg | ORAL_TABLET | Freq: Two times a day (BID) | ORAL | 4 refills | Status: DC

## 2016-12-09 MED ORDER — OXCARBAZEPINE 300 MG PO TABS
300.0000 mg | ORAL_TABLET | Freq: Two times a day (BID) | ORAL | 0 refills | Status: DC
Start: 2016-12-09 — End: 2017-01-02

## 2016-12-09 NOTE — Progress Notes (Signed)
[facility]  PATIENT: Caitlyn Gibson DOB: 07/25/1980   HISTORY OF PRESENT ILLNESS: Her seizure began in December of 1997. She was first seen at age 36, 06/09/1996. She was the product of an 8 month pregnancy complicated by amniocentesis at 4 months of gestation. A chromosome analysis was unremarkable. At 6-1/2 months gestation there was leaking from the placenta and she was born in hospital at 8 months gestation with a vertex presentation. She was early in motor developmental milestones. She did well in school, making A's and B's and never failed a grade. Her first seizure was a generalized major motor seizure 06/09/1996.  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 EEG 06/21/1996 showed left temporal sharp waves with phase reversals at the T3 electrode. She was placed on Tegretol was started after a second generalized major motor seizure 06/22/1996 .She has not had a generalized major motor seizure since 1997.  Her last MRI study of the brain 07/12/2003 with and without contrast enhancement was normal. She had two successful pregnancies on carbamazepine or Tegretol. She had a tubal ligation 09/2004. I have asked her to take vitamin D and calcium but she never has. She has never had a DEXA scan because of costs. She has no insurance.   She has auras of seizures and simple partial seizures that occured once per month. She says that her head feels "crazy" and that she has an "empty" feeling in her head. She hears a ticking sound that goes away in 2 minutes. There is no dj vu, strange odors,or tastes. She is on carbamazepine 200 mg tablets, 2 twice daily and 100 mg at noon.  Last level was 8.3 She still has those aura 1-2 times q 6 months. She had a thyroid mass on exam and was referred to Dr. [name].She underwent a thyroid ultrasound that showed bilateral calcifications and left thyroid nodule biopsy, showing a goiter. She was to see Dr. [name], She is on hold of her thyroid disease evaluation because of financial reasons  UPDATE June 4th 2018: Last visit was in February 2017, she had 2 generalized seizure at age 36, now taking her generic Tegretol 200 mg 2 tablets twice a day, she still has intermittent aura, described as tinkling sound in her head, lasting for less than 1 minute, no passing out, no generalized seizure activity, it happened every few months, usually clustered her around her menstruation period of time  REVIEW OF SYSTEMS: Full 14 system review of systems performed and notable only for those listed, all others are neg:  As above.  ALLERGIES: Allergies  Allergen Reactions  . Azithromycin   . Erythromycin     Cannot take with tegretol    HOME MEDICATIONS: Outpatient Medications Prior to Visit  Medication Sig Dispense Refill  . carbamazepine (TEGRETOL) 200 MG tablet TAKE 2 TABS BY MOUTH TWICE DAILY 120 tablet 0   No facility-administered medications prior to visit.     PAST MEDICAL HISTORY: Past Medical History:  Diagnosis Date  . Epilepsy (HCC)   . Thyroid mass     PAST SURGICAL HISTORY: Past Surgical History:  Procedure Laterality Date  . APPENDECTOMY  1998  . rotator cuff tear Left 05/2014  ongoing for year before  . TUBAL LIGATION      FAMILY HISTORY: Family History  Problem Relation Age of Onset  . Cancer Other   . Cancer Other        Ovarian Cancer-Grandmother  . Diabetes Maternal Uncle     SOCIAL HISTORY: Social History   Social History  . Marital status: Married    Spouse name: N/A  . Number of children: 2  . Years of education: N/A   Occupational History  . Waitress [employer]   Social History Main  Topics  . Smoking status: Current Every Day Smoker    Packs/day: 1.00    Types: Cigarettes  . Smokeless tobacco: Never Used  . Alcohol use No  . Drug use: No  . Sexual activity: Not on file   Other Topics Concern  . Not on file   Social History Narrative   Pt does not get regular exercise.   Lives with husband, 2 kids           PHYSICAL EXAM  Vitals:   12/09/16 1308  BP: 112/70  Pulse: 76  Weight: 122 lb 8 oz (55.6 kg)  Height: 5\' 4"  (1.626 m)   Body mass index is 21.03 kg/m. Generalized: In no acute distress Neck: Supple, no carotid bruits,  Musculoskeletal: No deformity, decreased range of motion to left shoulder  Neurological examination  Mentation: Alert oriented to time, place, history taking, and causual conversation,  Cranial nerve II-XII: Pupils were equal round reactive to light extraocular movements were full, visual field were full on confrontational test. facial sensation and strength were normal. hearing was intact to finger rubbing bilaterally. Uvula tongue midline. head turning and shoulder shrug and were normal and symmetric.Tongue protrusion into cheek strength was normal. Motor: normal tone, bulk and strength. Coordination: Normal finger to nose, heel-to-shin bilaterally  Gait: Rising up from seated position without assistance, normal stance, without trunk ataxia, moderate stride, good arm swing, smooth turning, able to perform tiptoe, and heel walking without difficulty.  Deep tendon reflexes: Brachioradialis 2/2, biceps 2/2, triceps 2/2, patellar 2/2, Achilles 2/2, plantar responses were flexor bilaterally.   DIAGNOSTIC DATA (LABS, IMAGING, TESTING) - I reviewed patient records, labs, notes, testing and imaging myself where available.  Lab Results  Component Value Date   WBC 13.7 (H) 05/28/2015   HGB 14.4 05/28/2015   HCT 43.3 05/28/2015   MCV 104.8 (H) 05/28/2015   PLT 259 05/28/2015      Component Value Date/Time   NA 140 05/28/2015  1028   K 3.9 05/28/2015 1028   CL 111 05/28/2015 1028   CO2 24 05/28/2015 1028   GLUCOSE 78 05/28/2015 1028   BUN 9 05/28/2015 1028   CREATININE 0.53 05/28/2015 1028   CALCIUM 8.7 (L) 05/28/2015 1028   GFRNONAA >60 05/28/2015 1028   GFRAA >60 05/28/2015 1028    Lab Results  Component Value Date   TSH 0.907 05/28/2015      ASSESSMENT AND PLAN  36 y.o. year old female    Complex partial seizure,  Still has recurrent spells, every few months,  She is concerned about the cost of Tegretol 200 mg 2 tablets twice a day  Will change to Trileptal 300 mg twice a day  Levert FeinsteinYijun Havyn Ramo, M.D. Ph.D.  [facility] [address] [place], [place] [address] Phone: [phone] Fax:      [phone]

## 2016-12-16 ENCOUNTER — Other Ambulatory Visit: Payer: Self-pay | Admitting: Nurse Practitioner

## 2017-01-02 ENCOUNTER — Telehealth: Payer: Self-pay | Admitting: Neurology

## 2017-01-02 MED ORDER — CARBAMAZEPINE 200 MG PO TABS: 400.0000 mg | ORAL_TABLET | Freq: Two times a day (BID) | ORAL | 3 refills | Status: DC

## 2017-01-02 NOTE — Addendum Note (Signed)
Addended by: Lindell SparKIRKMAN, MICHELLE C on: 01/02/2017 05:11 PM   Modules accepted: Orders

## 2017-01-02 NOTE — Telephone Encounter (Signed)
She was changed to Trileptal 300mg , BID at her last office visit due to cost of Tegretol.  She is unhappy with the way she feels on Trileptal and is requesting to switch back to Tegretol.  She was taking carbamazepine 200mg , two tablets BID.

## 2017-01-02 NOTE — Telephone Encounter (Signed)
Spoke to patient

## 2017-01-02 NOTE — Telephone Encounter (Signed)
Patient called office in reference to being switched to Oxcarbazepine (TRILEPTAL) 300 MG tablet per patient she is having a little bit of breakthrough, waking up weak, and having nightmares.  Patient would like to be switched back to Carbamazepine 200mg .  Pharmacy- [facility].  Please call

## 2017-01-02 NOTE — Telephone Encounter (Signed)
Per vo by Dr. Marjory LiesPenumalli, ok for patient to stop Trileptal and restart carbamazepine 200mg , 2 tablets BID.  Patient aware new rx has been sent to pharmacy.

## 2017-02-23 IMAGING — CT CT NECK W/ CM
2 of 3 series · 8 of 14 positions shown, 9 images · IV contrast (omnipaque)
Comparison: 09/11/2010 ultrasound-guided thyroid nodule biopsy.

CLINICAL DATA: Sore throat for 5 days with worsening throat pain.
Goiter.

EXAM:
CT NECK WITH CONTRAST
TECHNIQUE: Multidetector CT imaging of the neck was performed using the
standard protocol following the bolus administration of intravenous
contrast.
CONTRAST:  75mL OMNIPAQUE IOHEXOL 300 MG/ML  SOLN

[Series 2: axial neck · axial · 0.50mm/px · z∈[+434,+550]mm · 4 of 96 slices shown]
[im 20/96  bone]
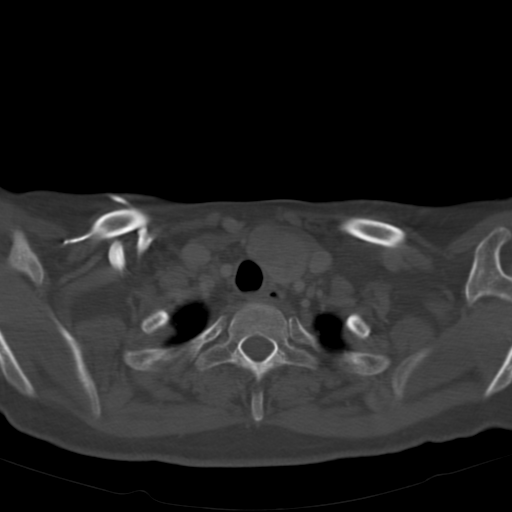
[im 39/96  bone]
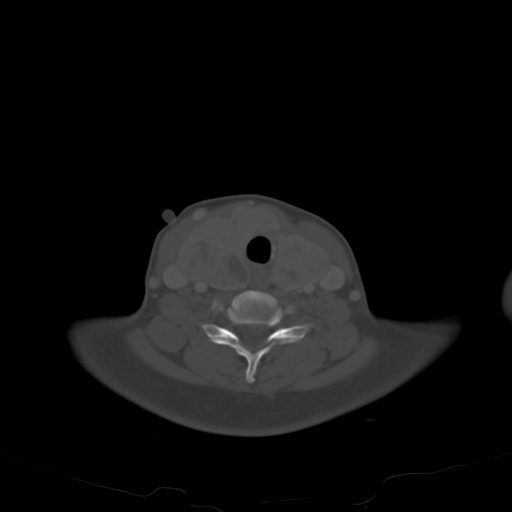
[im 58/96  bone]
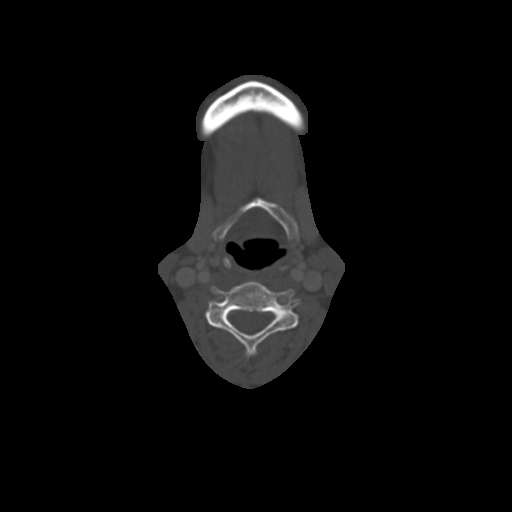
[im 77/96  bone]
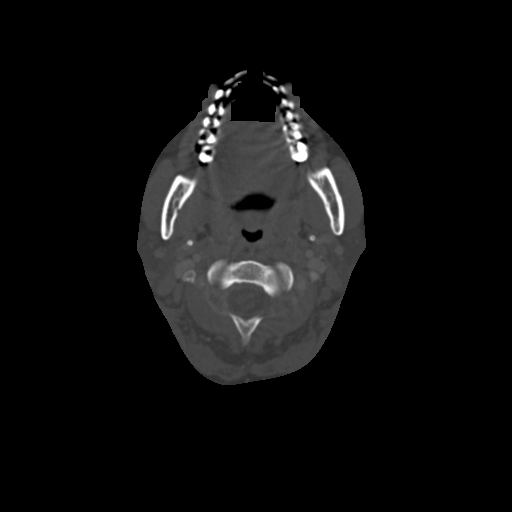

[Series 6: ax oropharynx · axial · 0.37mm/px · z∈[+436,+550]mm · 4 of 97 slices shown, 5 images]
[im 20/97  soft-tissue]
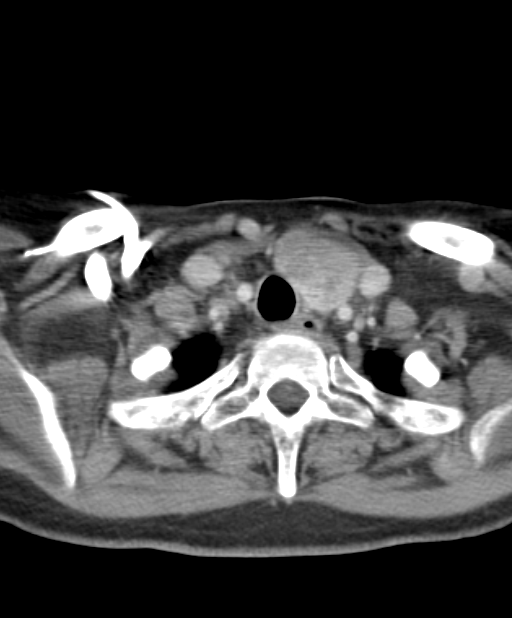
[im 20/97  bone]
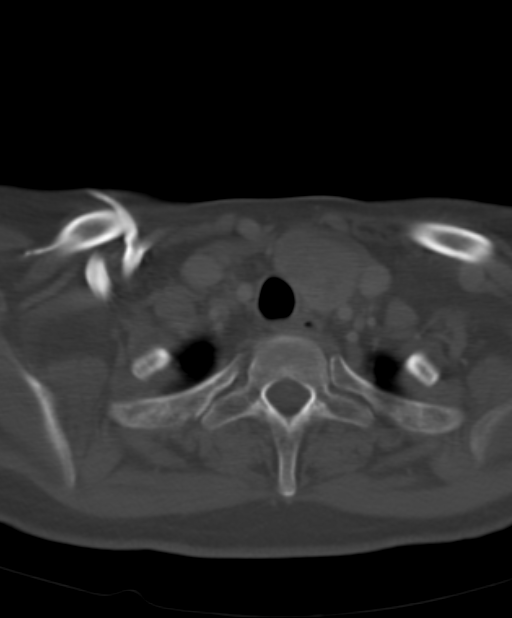
[im 39/97  bone]
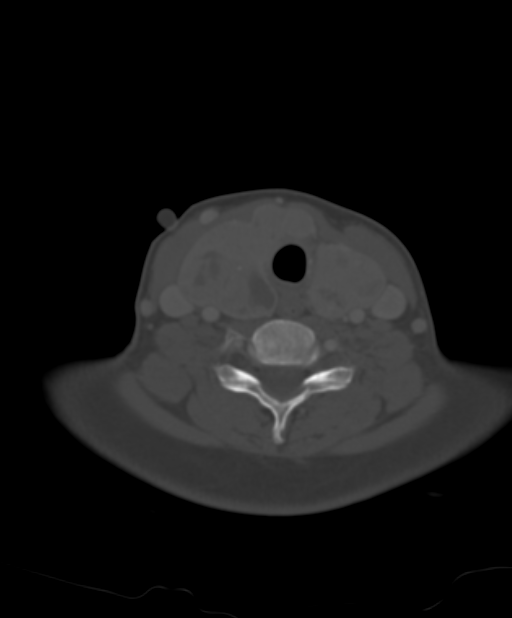
[im 58/97  bone]
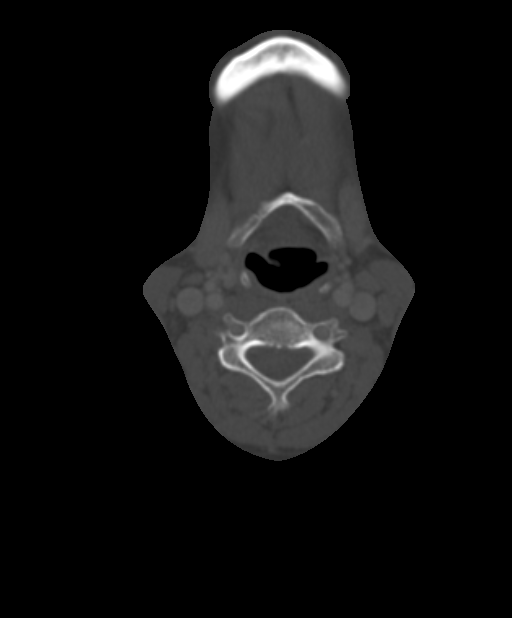
[im 77/97  bone]
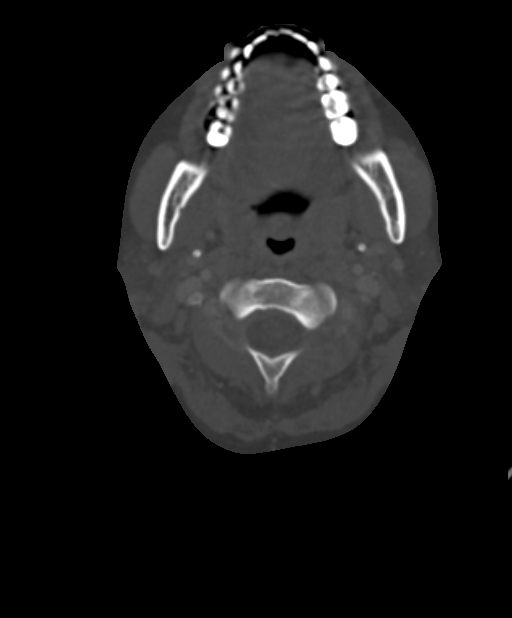

[8 of 14 positions shown; findings below may reference images not displayed]

FINDINGS: Pharynx and larynx: There is symmetric mild diffuse prominence of
the pharyngeal mucosal soft tissues in the posterior nasopharynx and
posterior oropharynx, including mild effacement of the vallecula by
soft tissue thickening at the base of the tongue, with no discrete
pharyngeal mass. The epiglottis appears within normal limits. No
retropharyngeal or peritonsillar fluid collections. No laryngeal
mass.

Salivary glands: Within normal limits.

Thyroid: There is a relatively symmetric multinodular goiter, with a
dominant partially calcified 2.7 cm hypodense left thyroid lobe
nodule. The largest heterogeneously hypodense right thyroid lobe
nodule measures 2.6 cm.

Lymph nodes: No pathologically enlarged cervical nodes.

Vascular: Within normal limits.

Limited intracranial: No acute abnormality.

Mastoids and visualized paranasal sinuses: Non-opacified mastoids.
Minimal mucosal thickening in the visualized inferior maxillary
sinuses bilaterally.

Skeleton: No aggressive appearing focal osseous lesions.

Upper chest: Unremarkable.
IMPRESSION: 1. Symmetric mild diffuse prominence of the posterior nasopharyngeal
and posterior or pharyngeal mucosal soft tissues with no discrete
pharyngeal mass, nonspecific, likely reactive. No retropharyngeal or
peritonsillar fluid collections. If the patient's symptoms resolve
with medical therapy, no further follow-up is required. If the
patient's throat symptoms persist despite medical therapy, ENT
consultation is advised.
2. Multinodular goiter as described, with a dominant partially
calcified 2.7 cm left thyroid lobe nodule. Advise correlation with
thyroid ultrasound.

## 2017-07-02 ENCOUNTER — Telehealth: Payer: Self-pay | Admitting: Neurology

## 2017-07-02 NOTE — Telephone Encounter (Signed)
Nicole/[facility] [phone] called said [facility] will be supplying carbamazepine (TEGRETOL) 200 MG tablet, she is calling to verify this new manufacturer will be ok. Please call to advise

## 2017-07-02 NOTE — Telephone Encounter (Signed)
Returned call to the pharmacy - patient has been on generic medication in the past with no reported problems.  [facility] stated this was a courtesy call to notify that the generic manufacturer will be changing to another company.  They will speak to the patient about this change when she picks up her prescription.

## 2017-11-27 ENCOUNTER — Other Ambulatory Visit: Payer: Self-pay | Admitting: Diagnostic Neuroimaging

## 2017-12-09 ENCOUNTER — Ambulatory Visit: Payer: Self-pay | Admitting: Nurse Practitioner

## 2018-10-20 ENCOUNTER — Other Ambulatory Visit: Payer: Self-pay | Admitting: Diagnostic Neuroimaging

## 2019-10-11 ENCOUNTER — Other Ambulatory Visit: Payer: Self-pay | Admitting: Neurology

## 2019-10-12 ENCOUNTER — Ambulatory Visit: Payer: Self-pay | Admitting: Neurology

## 2019-10-12 ENCOUNTER — Other Ambulatory Visit: Payer: Self-pay

## 2019-10-12 ENCOUNTER — Encounter: Payer: Self-pay | Admitting: Neurology

## 2019-10-12 VITALS — BP 126/81 | HR 79 | Temp 98.8°F | Ht 65.0 in | Wt 133.8 lb

## 2019-10-12 DIAGNOSIS — G40909 Epilepsy, unspecified, not intractable, without status epilepticus: Secondary | ICD-10-CM

## 2019-10-12 MED ORDER — CARBAMAZEPINE 200 MG PO TABS: 400.0000 mg | ORAL_TABLET | Freq: Two times a day (BID) | ORAL | 4 refills | Status: DC

## 2019-10-12 NOTE — Progress Notes (Signed)
PATIENT: Caitlyn Gibson DOB: 11-Oct-1980  REASON FOR VISIT: follow up HISTORY FROM: patient  HISTORY OF PRESENT ILLNESS: Today 10/12/19  HISTORY Her seizure began in December of 1997. She was first seen at age 39, 06/09/1996. She was the product of an 8 month pregnancy complicated by amniocentesis at 4 months of gestation. A chromosome analysis was unremarkable. At 6-1/2 months gestation there was leaking from the placenta and she was born in hospital at 8 months gestation with a vertex presentation. She was early in motor developmental milestones. She did well in school, making A's and B's and never failed a grade. Her first seizure was a generalized major motor seizure 06/09/1996.  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 EEG 06/21/1996 showed left temporal sharp waves with phase reversals at the T3 electrode. She was placed on Tegretol was started after a second generalized major motor seizure 06/22/1996 .She has not had a generalized major motor seizure since 1997.  Her last MRI study of the brain 07/12/2003 with and without contrast enhancement was normal. She had two successful pregnancies on carbamazepine or Tegretol. She had a tubal ligation 09/2004. I have asked her to take vitamin D and calcium but she never has. She has never had a DEXA scan because of costs. She has no insurance.   She has auras of seizures and simple partial seizures that occured once per month. She says that her head feels "crazy" and that she has an "empty" feeling in her head. She hears a ticking sound that goes away in 2 minutes. There is no dj vu, strange odors,or tastes. She is on carbamazepine 200  mg tablets, 2 twice daily and 100 mg at noon. Last level was 8.3 She still has those aura 1-2 times q 6 months. She had a thyroid mass on exam and was referred to Dr. [name].She underwent a thyroid ultrasound that showed bilateral calcifications and left thyroid nodule biopsy, showing a goiter. She was to see Dr. [name], She is on hold of her thyroid disease evaluation because of financial reasons  UPDATE June 4th 2018: Last visit was in February 2017, she had 2 generalized seizure at age 39, now taking her generic Tegretol 200 mg 2 tablets twice a day, she still has intermittent aura, described as tinkling sound in her head, lasting for less than 1 minute, no passing out, no generalized seizure activity, it happened every few months, usually clustered her around her menstruation period of time  Update October 12, 2019 SS: Has not been seen since June 2018.  At that time she was switched off Tegretol due to cost, changed to Trileptal 300 mg twice a day.  She could tolerate the switch due to side effect, went back to Tegretol.  She is currently taking Tegretol 200 mg, 2 tablets twice daily.  Tolerating well.  She may have an occasional aura around her menstrual cycle, like a ticking sound in her head.  She has not had overt seizure.  She has history of goiter.  She has not been seen by PCP in a while.  She has been working a lot lately, is Insurance claims handler.  REVIEW OF SYSTEMS: Out of a complete 14  system review of symptoms, the patient complains only of the following symptoms, and all other reviewed systems are negative.  Seizures  ALLERGIES: Allergies  Allergen Reactions  . Azithromycin   . Erythromycin     Cannot take with tegretol    HOME MEDICATIONS: Outpatient Medications Prior to Visit  Medication Sig Dispense Refill  . carbamazepine (TEGRETOL) 200 MG tablet TAKE 2 TABLETS (400 MG TOTAL) BY MOUTH 2 (TWO) TIMES DAILY. 360 tablet 3   No facility-administered medications  prior to visit.    PAST MEDICAL HISTORY: Past Medical History:  Diagnosis Date  . Epilepsy (HCC)   . Thyroid mass     PAST SURGICAL HISTORY: Past Surgical History:  Procedure Laterality Date  . APPENDECTOMY  1998  . rotator cuff tear Left 05/2014   ongoing for year before  . TUBAL LIGATION      FAMILY HISTORY: Family History  Problem Relation Age of Onset  . Cancer Other   . Cancer Other        Ovarian Cancer-Grandmother  . Diabetes Maternal Uncle     SOCIAL HISTORY: Social History   Socioeconomic History  . Marital status: Married    Spouse name: Not on file  . Number of children: 2  . Years of education: Not on file  . Highest education level: Not on file  Occupational History  . Occupation: Retail buyer: [employer]  Tobacco Use  . Smoking status: Current Every Day Smoker    Packs/day: 1.00    Types: Cigarettes  . Smokeless tobacco: Never Used  Substance and Sexual Activity  . Alcohol use: No    Alcohol/week: 0.0 standard drinks  . Drug use: No  . Sexual activity: Not on file  Other Topics Concern  . Not on file  Social History Narrative   Pt does not get regular exercise.   Lives with husband, 2 kids      Social Determinants of Health   Financial Resource Strain:   . Difficulty of Paying Living Expenses:   Food Insecurity:   . Worried About Programme researcher, broadcasting/film/video in the Last Year:   . Barista in the Last Year:   Transportation Needs:   . Freight forwarder (Medical):   Marland Kitchen Lack of Transportation (Non-Medical):   Physical Activity:   . Days of Exercise per Week:   . Minutes of Exercise per Session:   Stress:   . Feeling of Stress :   Social Connections:   . Frequency of Communication with Friends and Family:   . Frequency of Social Gatherings with Friends and Family:   . Attends Religious Services:   . Active Member of Clubs or Organizations:   . Attends Banker Meetings:   Marland Kitchen Marital Status:   Intimate Partner  Violence:   . Fear of Current or Ex-Partner:   . Emotionally Abused:   Marland Kitchen Physically Abused:   . Sexually Abused:    PHYSICAL EXAM  Vitals:   10/12/19 1423  BP: 126/81  Pulse: 79  Temp: 98.8 F (37.1 C)  Weight: 133 lb 12.8 oz (60.7 kg)  Height: 5\' 5"  (1.651 m)   Body mass index is 22.27 kg/m.  Generalized: Well developed, in no acute distress   Neurological examination  Mentation: Alert oriented to time, place, history taking. Follows all commands speech and language fluent Cranial nerve II-XII: Pupils were equal round reactive to light. Extraocular movements were full, visual field were full on confrontational test.  Facial sensation and strength were normal. Head turning and shoulder shrug were normal and symmetric.Goiter to right neck noted. Motor: The motor testing reveals 5 over 5 strength of all 4 extremities. Good symmetric motor tone is noted throughout.  Sensory: Sensory testing is intact to soft touch on all 4 extremities. No evidence of extinction is noted.  Coordination: Cerebellar testing reveals good finger-nose-finger and heel-to-shin bilaterally.  Gait and station: Gait is normal. Tandem gait is normal. Romberg is negative. No drift is seen.  Reflexes: Deep tendon reflexes are symmetric and normal bilaterally.   DIAGNOSTIC DATA (LABS, IMAGING, TESTING) - I reviewed patient records, labs, notes, testing and imaging myself where available.  Lab Results  Component Value Date   WBC 13.7 (H) 05/28/2015   HGB 14.4 05/28/2015   HCT 43.3 05/28/2015   MCV 104.8 (H) 05/28/2015   PLT 259 05/28/2015      Component Value Date/Time   NA 140 05/28/2015 1028   K 3.9 05/28/2015 1028   CL 111 05/28/2015 1028   CO2 24 05/28/2015 1028   GLUCOSE 78 05/28/2015 1028   BUN 9 05/28/2015 1028   CREATININE 0.53 05/28/2015 1028   CALCIUM 8.7 (L) 05/28/2015 1028   GFRNONAA >60 05/28/2015 1028   GFRAA >60 05/28/2015 1028   No results found for: CHOL, HDL, LDLCALC, LDLDIRECT,  TRIG, CHOLHDL No results found for: HGBA1C No results found for: VITAMINB12 Lab Results  Component Value Date   TSH 0.907 05/28/2015      ASSESSMENT AND PLAN 39 y.o. year old female  has a past medical history of Epilepsy ([place]) and Thyroid mass. here with:  1.  Complex partial seizure -Remains stable, doing well, may have occasional aura around her menstrual cycle -Continue Tegretol 200 mg, 2 tablets twice daily -We will check blood work today, including TSH has goiter  -Call for recurrent seizure, otherwise follow-up in 1 year  I spent 20 minutes of face-to-face and non-face-to-face time with patient.  This included previsit chart review, lab review, study review, order entry, electronic health record documentation, patient education.  Butler Denmark, AGNP-C, DNP 10/12/2019, 2:42 PM [facility] [address] [place], [place] [address] [phone]

## 2019-10-12 NOTE — Patient Instructions (Signed)
It was nice to meet you today! Continue current medications  Check blood work today  See you in 1 year or sooner if needed

## 2019-10-13 LAB — COMPREHENSIVE METABOLIC PANEL
ALT: 14 IU/L (ref 0–32)
AST: 18 IU/L (ref 0–40)
Albumin/Globulin Ratio: 1.7 (ref 1.2–2.2)
Albumin: 4.1 g/dL (ref 3.8–4.8)
Alkaline Phosphatase: 96 IU/L (ref 39–117)
BUN/Creatinine Ratio: 17 (ref 9–23)
BUN: 8 mg/dL (ref 6–20)
Bilirubin Total: 0.4 mg/dL (ref 0.0–1.2)
CO2: 19 mmol/L — ABNORMAL LOW (ref 20–29)
Calcium: 8.8 mg/dL (ref 8.7–10.2)
Chloride: 106 mmol/L (ref 96–106)
Creatinine, Ser: 0.47 mg/dL — ABNORMAL LOW (ref 0.57–1.00)
GFR calc Af Amer: 145 mL/min/{1.73_m2} (ref >59)
GFR calc non Af Amer: 126 mL/min/{1.73_m2} (ref >59)
Globulin, Total: 2.4 g/dL (ref 1.5–4.5)
Glucose: 77 mg/dL (ref 65–99)
Potassium: 4.4 mmol/L (ref 3.5–5.2)
Sodium: 138 mmol/L (ref 134–144)
Total Protein: 6.5 g/dL (ref 6.0–8.5)

## 2019-10-13 LAB — CBC WITH DIFFERENTIAL/PLATELET
Basophils Absolute: 0 10*3/uL (ref 0.0–0.2)
Basos: 1 %
EOS (ABSOLUTE): 0.2 10*3/uL (ref 0.0–0.4)
Eos: 2 %
Hematocrit: 41.4 % (ref 34.0–46.6)
Hemoglobin: 14.4 g/dL (ref 11.1–15.9)
Immature Grans (Abs): 0 10*3/uL (ref 0.0–0.1)
Immature Granulocytes: 0 %
Lymphocytes Absolute: 2.8 10*3/uL (ref 0.7–3.1)
Lymphs: 37 %
MCH: 36.1 pg — ABNORMAL HIGH (ref 26.6–33.0)
MCHC: 34.8 g/dL (ref 31.5–35.7)
MCV: 104 fL — ABNORMAL HIGH (ref 79–97)
Monocytes Absolute: 0.4 10*3/uL (ref 0.1–0.9)
Monocytes: 6 %
Neutrophils Absolute: 4.2 10*3/uL (ref 1.4–7.0)
Neutrophils: 54 %
Platelets: 245 10*3/uL (ref 150–450)
RBC: 3.99 x10E6/uL (ref 3.77–5.28)
RDW: 11.4 % — ABNORMAL LOW (ref 11.7–15.4)
WBC: 7.7 10*3/uL (ref 3.4–10.8)

## 2019-10-13 LAB — CARBAMAZEPINE LEVEL, TOTAL: Carbamazepine (Tegretol), S: 13 ug/mL (ref 4.0–12.0)

## 2019-10-13 LAB — TSH: TSH: 1.06 u[IU]/mL (ref 0.450–4.500)

## 2019-10-18 ENCOUNTER — Telehealth: Payer: Self-pay | Admitting: *Deleted

## 2019-10-18 ENCOUNTER — Encounter: Payer: Self-pay | Admitting: *Deleted

## 2019-10-18 NOTE — Telephone Encounter (Signed)
-----   Message from Glean Salvo, NP sent at 10/13/2019  7:56 AM EDT ----- Labs show elevated carbamazepine level, was not trough level. Have her come back for repeat trough level. Otherwise no significant abnormality. 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 Suggest she see PCP for routine physical when she is able.

## 2019-10-18 NOTE — Telephone Encounter (Signed)
Attempted several days to call and speak to pt.  I finally sent letter with results.  She may call if questions.

## 2020-10-11 NOTE — Progress Notes (Signed)
PATIENT: Caitlyn Gibson DOB: April 10, 1981  REASON FOR VISIT: follow up HISTORY FROM: patient  HISTORY OF PRESENT ILLNESS: Today 10/12/20  HISTORY Her seizure began in December of 1997. She was first seen at age 40, 06/09/1996. She was the product of an 8 month pregnancy complicated by amniocentesis at 4 months of gestation. A chromosome analysis was unremarkable. At 6-1/2 months gestation there was leaking from the placenta and she was born in hospital at 8 months gestation with a vertex presentation. She was early in motor developmental milestones. She did well in school, making A's and B's and never failed a grade. Her first seizure was a generalized major motor seizure 06/09/1996.  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 EEG 06/21/1996 showed left temporal sharp waves with phase reversals at the T3 electrode. She was placed on Tegretol was started after a second generalized major motor seizure 06/22/1996 .She has not had a generalized major motor seizure since 1997.  Her last MRI study of the brain 07/12/2003 with and without contrast enhancement was normal. She had two successful pregnancies on carbamazepine or Tegretol. She had a tubal ligation 09/2004. I have asked her to take vitamin D and calcium but she never has. She has never had a DEXA scan because of costs. She has no insurance.   She has auras of seizures and simple partial seizures that occured once per month. She says that her head feels "crazy" and that she has an "empty" feeling in her head. She hears a ticking sound that goes away in 2 minutes. There is no dj vu, strange odors,or tastes. She is on carbamazepine 200  mg tablets, 2 twice daily and 100 mg at noon. Last level was 8.3 She still has those aura 1-2 times q 6 months. She had a thyroid mass on exam and was referred to Dr. [name].She underwent a thyroid ultrasound that showed bilateral calcifications and left thyroid nodule biopsy, showing a goiter. She was to see Dr. [name], She is on hold of her thyroid disease evaluation because of financial reasons  UPDATE June 4th 2018: Last visit was in February 2017, she had 2 generalized seizure at age 41, now taking her generic Tegretol 200 mg 2 tablets twice a day, she still has intermittent aura, described as tinkling sound in her head, lasting for less than 1 minute, no passing out, no generalized seizure activity, it happened every few months, usually clustered her around her menstruation period of time  Update October 12, 2019 SS: Has not been seen since June 2018.  At that time she was switched off Tegretol due to cost, changed to Trileptal 300 mg twice a day.  She could tolerate the switch due to side effect, went back to Tegretol.  She is currently taking Tegretol 200 mg, 2 tablets twice daily.  Tolerating well.  She may have an occasional aura around her menstrual cycle, like a ticking sound in her head.  She has not had overt seizure.  She has history of goiter.  She has not been seen by PCP in a while.  She has been working a lot lately, is Insurance claims handler.  Update October 12, 2020 SS: Last visit in  April 2021, random carbamazepine level was elevated 13.0, TSH was normal 1.060.  Remains on Tegretol 200 mg, 2 tablets twice daily.  Denies any seizures since last seen.  May have occasional aura around her menstrual cycle, ticking noise she hears.  Continues to work at [employer], works a lot of hours.  Does not have any health insurance.  Is not established with PCP.  Has a goiter.  REVIEW OF SYSTEMS: Out of a complete 14 system review of symptoms, the patient complains only of the following  symptoms, and all other reviewed systems are negative.  Seizures  ALLERGIES: Allergies  Allergen Reactions  . Azithromycin   . Erythromycin     Cannot take with tegretol    HOME MEDICATIONS: Outpatient Medications Prior to Visit  Medication Sig Dispense Refill  . carbamazepine (TEGRETOL) 200 MG tablet Take 2 tablets (400 mg total) by mouth 2 (two) times daily. 360 tablet 4   No facility-administered medications prior to visit.    PAST MEDICAL HISTORY: Past Medical History:  Diagnosis Date  . Epilepsy (HCC)   . Thyroid mass     PAST SURGICAL HISTORY: Past Surgical History:  Procedure Laterality Date  . APPENDECTOMY  1998  . rotator cuff tear Left 05/2014   ongoing for year before  . TUBAL LIGATION      FAMILY HISTORY: Family History  Problem Relation Age of Onset  . Cancer Other   . Cancer Other        Ovarian Cancer-Grandmother  . Diabetes Maternal Uncle     SOCIAL HISTORY: Social History   Socioeconomic History  . Marital status: Married    Spouse name: Not on file  . Number of children: 2  . Years of education: Not on file  . Highest education level: Not on file  Occupational History  . Occupation: Retail buyer: [employer]  Tobacco Use  . Smoking status: Current Every Day Smoker    Packs/day: 1.00    Types: Cigarettes  . Smokeless tobacco: Never Used  Substance and Sexual Activity  . Alcohol use: No    Alcohol/week: 0.0 standard drinks  . Drug use: No  . Sexual activity: Not on file  Other Topics Concern  . Not on file  Social History Narrative   Pt does not get regular exercise.   Lives with husband, 2 kids      Social Determinants of Health   Financial Resource Strain: Not on file  Food Insecurity: Not on file  Transportation Needs: Not on file  Physical Activity: Not on file  Stress: Not on file  Social Connections: Not on file  Intimate Partner Violence: Not on file   PHYSICAL EXAM  Vitals:   10/12/20 0912  BP: 129/81   Pulse: 73  Weight: 140 lb (63.5 kg)  Height: 5\' 4"  (1.626 m)   Body mass index is 24.03 kg/m.  Generalized: Well developed, in no acute distress   Neurological examination  Mentation: Alert oriented to time, place, history taking. Follows all commands speech and language fluent Cranial nerve II-XII: Pupils were equal round reactive to light. Extraocular movements were full, visual field were full on confrontational test. Facial sensation and strength were normal. Head turning and shoulder shrug were normal and symmetric.Goiter to right neck noted. Motor: The motor testing reveals 5 over 5 strength of all 4 extremities. Good symmetric motor tone is noted throughout.  Sensory: Sensory testing is intact to soft touch on all 4 extremities. No evidence of  extinction is noted.  Coordination: Cerebellar testing reveals good finger-nose-finger and heel-to-shin bilaterally.  Gait and station: Gait is normal. Tandem gait is normal.  Reflexes: Deep tendon reflexes are symmetric and normal bilaterally.   DIAGNOSTIC DATA (LABS, IMAGING, TESTING) - I reviewed patient records, labs, notes, testing and imaging myself where available.  Lab Results  Component Value Date   WBC 7.7 10/12/2019   HGB 14.4 10/12/2019   HCT 41.4 10/12/2019   MCV 104 (H) 10/12/2019   PLT 245 10/12/2019      Component Value Date/Time   NA 138 10/12/2019 1447   K 4.4 10/12/2019 1447   CL 106 10/12/2019 1447   CO2 19 (L) 10/12/2019 1447   GLUCOSE 77 10/12/2019 1447   GLUCOSE 78 05/28/2015 1028   BUN 8 10/12/2019 1447   CREATININE 0.47 (L) 10/12/2019 1447   CALCIUM 8.8 10/12/2019 1447   PROT 6.5 10/12/2019 1447   ALBUMIN 4.1 10/12/2019 1447   AST 18 10/12/2019 1447   ALT 14 10/12/2019 1447   ALKPHOS 96 10/12/2019 1447   BILITOT 0.4 10/12/2019 1447   GFRNONAA 126 10/12/2019 1447   GFRAA 145 10/12/2019 1447   No results found for: CHOL, HDL, LDLCALC, LDLDIRECT, TRIG, CHOLHDL No results found for: JQBH4L No  results found for: VITAMINB12 Lab Results  Component Value Date   TSH 1.060 10/12/2019    ASSESSMENT AND PLAN 40 y.o. year old female  has a past medical history of Epilepsy (HCC) and Thyroid mass. here with:  1.  Complex partial seizure -Remains stable, doing well, may have occasional aura around her menstrual cycle -Continue Tegretol 200 mg, 2 tablets twice daily, given printed script -We will check blood work today -Encouraged to follow-up with PCP for annual physical, check into options for health insurance in her area, any free clinics -Call for recurrent seizure, otherwise follow-up in 1 year  I spent 20 minutes of face-to-face and non-face-to-face time with patient.  This included previsit chart review, lab review, study review, order entry, electronic health record documentation, patient education.  Margie Ege, AGNP-C, DNP 10/12/2020, 9:24 AM [facility] [address] [place], [place] [address] [phone]

## 2020-10-12 ENCOUNTER — Encounter: Payer: Self-pay | Admitting: Neurology

## 2020-10-12 ENCOUNTER — Ambulatory Visit (INDEPENDENT_AMBULATORY_CARE_PROVIDER_SITE_OTHER): Payer: Self-pay | Admitting: Neurology

## 2020-10-12 VITALS — BP 129/81 | HR 73 | Ht 64.0 in | Wt 140.0 lb

## 2020-10-12 DIAGNOSIS — G40909 Epilepsy, unspecified, not intractable, without status epilepticus: Secondary | ICD-10-CM

## 2020-10-12 MED ORDER — CARBAMAZEPINE 200 MG PO TABS: 400.0000 mg | ORAL_TABLET | Freq: Two times a day (BID) | ORAL | 4 refills | Status: DC

## 2020-10-12 NOTE — Patient Instructions (Signed)
Continue current medication Check labs  Call for seizures Please establish with primary care  See you back in 1 year

## 2020-10-13 LAB — COMPREHENSIVE METABOLIC PANEL
ALT: 19 IU/L (ref 0–32)
AST: 16 IU/L (ref 0–40)
Albumin/Globulin Ratio: 1.6 (ref 1.2–2.2)
Albumin: 4.1 g/dL (ref 3.8–4.8)
Alkaline Phosphatase: 114 IU/L (ref 44–121)
BUN/Creatinine Ratio: 14 (ref 9–23)
BUN: 8 mg/dL (ref 6–20)
Bilirubin Total: 0.3 mg/dL (ref 0.0–1.2)
CO2: 22 mmol/L (ref 20–29)
Calcium: 9.3 mg/dL (ref 8.7–10.2)
Chloride: 106 mmol/L (ref 96–106)
Creatinine, Ser: 0.58 mg/dL (ref 0.57–1.00)
Globulin, Total: 2.5 g/dL (ref 1.5–4.5)
Glucose: 79 mg/dL (ref 65–99)
Potassium: 4.6 mmol/L (ref 3.5–5.2)
Sodium: 143 mmol/L (ref 134–144)
Total Protein: 6.6 g/dL (ref 6.0–8.5)
eGFR: 118 mL/min/{1.73_m2} (ref >59)

## 2020-10-13 LAB — CBC WITH DIFFERENTIAL/PLATELET
Basophils Absolute: 0 10*3/uL (ref 0.0–0.2)
Basos: 1 %
EOS (ABSOLUTE): 0.2 10*3/uL (ref 0.0–0.4)
Eos: 3 %
Hematocrit: 39.3 % (ref 34.0–46.6)
Hemoglobin: 14.1 g/dL (ref 11.1–15.9)
Immature Grans (Abs): 0 10*3/uL (ref 0.0–0.1)
Immature Granulocytes: 0 %
Lymphocytes Absolute: 1.5 10*3/uL (ref 0.7–3.1)
Lymphs: 30 %
MCH: 37.3 pg — ABNORMAL HIGH (ref 26.6–33.0)
MCHC: 35.9 g/dL — ABNORMAL HIGH (ref 31.5–35.7)
MCV: 104 fL — ABNORMAL HIGH (ref 79–97)
Monocytes Absolute: 0.4 10*3/uL (ref 0.1–0.9)
Monocytes: 8 %
Neutrophils Absolute: 3 10*3/uL (ref 1.4–7.0)
Neutrophils: 58 %
Platelets: 262 10*3/uL (ref 150–450)
RBC: 3.78 x10E6/uL (ref 3.77–5.28)
RDW: 10.9 % — ABNORMAL LOW (ref 11.7–15.4)
WBC: 5.1 10*3/uL (ref 3.4–10.8)

## 2020-10-13 LAB — CARBAMAZEPINE LEVEL, TOTAL: Carbamazepine (Tegretol), S: 11.9 ug/mL (ref 4.0–12.0)

## 2020-10-17 ENCOUNTER — Telehealth: Payer: Self-pay | Admitting: *Deleted

## 2020-10-17 NOTE — Telephone Encounter (Signed)
Called pt and LMVM for her about results of labs, (will send mychart message with more specifics) she is to call if questions.

## 2020-10-17 NOTE — Telephone Encounter (Signed)
-----   Message from Glean Salvo, NP sent at 10/16/2020  4:05 PM EDT ----- Please call the patient, labs are stable but show macrocytic changes on CBC without anemia, would again recommend seeing a PCP for evaluation, needs physical, as known goiter. We talked about the importance of routine care, she doesn't have health insurance, if she is willing referral to community health and wellness?

## 2020-10-19 ENCOUNTER — Telehealth: Payer: Self-pay | Admitting: *Deleted

## 2020-10-19 NOTE — Telephone Encounter (Signed)
-----   Message from Sarah J Slack, NP sent at 10/16/2020  4:05 PM EDT ----- Please call the patient, labs are stable but show macrocytic changes on CBC without anemia, would again recommend seeing a PCP for evaluation, needs physical, as known goiter. We talked about the importance of routine care, she doesn't have health insurance, if she is willing referral to community health and wellness?  

## 2020-10-19 NOTE — Telephone Encounter (Signed)
Called pt and relayed that her labs stable, except for macrocytic changes,  no anemia.  Recommend seeing pcp (would refer to [facility] if willing) for goiter.   She said will hold off for now. Will let us know id changes her mind.  She verbalized understanding.

## 2020-10-31 NOTE — Telephone Encounter (Signed)
Mailed mychart message to pt

## 2020-12-23 ENCOUNTER — Other Ambulatory Visit: Payer: Self-pay | Admitting: Neurology

## 2021-03-21 NOTE — Progress Notes (Signed)
Chart reviewed, agree above plan ?

## 2021-10-16 ENCOUNTER — Ambulatory Visit (INDEPENDENT_AMBULATORY_CARE_PROVIDER_SITE_OTHER): Payer: Self-pay | Admitting: Neurology

## 2021-10-16 ENCOUNTER — Encounter: Payer: Self-pay | Admitting: Neurology

## 2021-10-16 VITALS — BP 135/75 | HR 67 | Ht 64.0 in | Wt 147.5 lb

## 2021-10-16 DIAGNOSIS — G40909 Epilepsy, unspecified, not intractable, without status epilepticus: Secondary | ICD-10-CM

## 2021-10-16 MED ORDER — CARBAMAZEPINE 200 MG PO TABS: 400.0000 mg | ORAL_TABLET | Freq: Two times a day (BID) | ORAL | 4 refills | Status: DC

## 2021-10-16 NOTE — Progress Notes (Signed)
? ? ?Patient: Caitlyn Gibson ?Date of birth: 10-Apr-1981 ? ?Reason for visit:  follow up for seizures ?History from: Patient ?Primary neurologist: Dr. Krista Blue ? ?HISTORY ?Her seizure began in December of 1997.  She was first seen at age 41, 06/09/1996. She was the product of an 8 month pregnancy complicated by amniocentesis at 4 months of gestation. A chromosome analysis was unremarkable. At 6-1/2 months gestation there  was leaking from the placenta and she was born in hospital at  8 months gestation with a vertex presentation. She was early in motor developmental milestones. She did well in school, making A's and B's and never failed a grade. Her first seizure was a generalized major motor seizure 06/09/1996.   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 EEG 06/21/1996 showed  left temporal sharp waves with phase reversals at the T3 electrode. She was placed on Tegretol was started after a second generalized major motor seizure 06/22/1996 .She has not had a generalized major motor seizure since 1997.   ?Her last MRI study of the brain 07/12/2003 with and without contrast enhancement was normal.  She had two successful pregnancies on carbamazepine or Tegretol. She had a tubal ligation  09/2004. I have asked her to take vitamin D and calcium but she never has. She has never had a DEXA scan because of costs. She has no insurance.   ?  ?She has auras of seizures and simple partial seizures that occured once per month. She says that her head feels "crazy" and that she has an "empty" feeling in her head. She hears a ticking sound that  goes away in 2 minutes. There is no d?j? vu, strange odors,or tastes. ?She is on  carbamazepine 200 mg tablets, 2 twice daily and 100 mg at noon. Last level was 8.3 She still has those aura 1-2 times q 6 months. ?She had a thyroid mass on exam and was referred to Dr. Rex Kras.She underwent a thyroid ultrasound  that showed bilateral calcifications and left thyroid nodule biopsy, showing a goiter. She was to see Dr. Renato Shin,  She is on hold of her thyroid disease evaluation because of financial reasons ?  ?UPDATE June 4th 2018: ?Last visit was in February 2017, she had 2 generalized seizure at age 67, now taking her generic Tegretol 200 mg 2 tablets twice a day, she still has intermittent aura, described as tinkling sound in her head, lasting for less than 1 minute, no passing out, no generalized seizure activity, it happened every few months, usually clustered her around her menstruation period of time ? ?Update October 12, 2019 SS: Has not been seen since June 2018.  At that time she was switched off Tegretol due to cost, changed to Trileptal 300 mg twice a day.  She could tolerate the switch due to side effect, went back to Tegretol.  She is currently taking Tegretol 200 mg, 2 tablets twice daily.  Tolerating well.  She may have an occasional aura around her menstrual cycle, like a ticking sound in her head.  She has not had overt seizure.  She has history of goiter.  She has not been seen by PCP in a while.  She has been  working a lot lately, is Academic librarian. ? ?Update October 12, 2020 SS: Last visit in April 2021, random carbamazepine level was elevated 13.0, TSH was normal 1.060.  Remains on Tegretol 200 mg, 2 tablets twice daily.  Denies any seizures since last seen.  May have occasional aura around her menstrual cycle, ticking noise she hears.  Continues to work at [employer], works a lot of hours.  Does not have any health insurance.  Is not established with PCP.  Has a goiter. ? ?Update October 16, 2021 SS: Last visit in April 2022 carbamazepine level was 11.9, CMP was  unremarkable, CBC showed some macrocytic changes, Hgb was 14.1. no seizures, around time of period, can get ticking noise 1 time for a few minutes, goes away. Doesn't have any insurance, carbamazepine is $70 every 3 months. Has goiter, feels getting bigger, knows needs to see endocrinology Dr. Loanne Drilling in the past.  ? ?REVIEW OF SYSTEMS: Out of a complete 14 system review of symptoms, the patient complains only of the following symptoms, and all other reviewed systems are negative. ? ?See HPI ? ?ALLERGIES: ?Allergies  ?Allergen Reactions  ? Azithromycin   ? Erythromycin   ?  Cannot take with tegretol  ? ? ?HOME MEDICATIONS: ?Outpatient Medications Prior to Visit  ?Medication Sig Dispense Refill  ? carbamazepine (TEGRETOL) 200 MG tablet Take 2 tablets (400 mg total) by mouth 2 (two) times daily. 360 tablet 4  ? ?No facility-administered medications prior to visit.  ? ? ?PAST MEDICAL HISTORY: ?Past Medical History:  ?Diagnosis Date  ? Epilepsy ([place])   ? Thyroid mass   ? ? ?PAST SURGICAL HISTORY: ?Past Surgical History:  ?Procedure Laterality Date  ? APPENDECTOMY  1998  ? rotator cuff tear Left 05/2014  ? ongoing for year before  ? TUBAL LIGATION    ? ? ?FAMILY HISTORY: ?Family History  ?Problem Relation Age of Onset  ? Cancer Other   ? Cancer Other   ?     Ovarian Cancer-Grandmother  ? Diabetes Maternal Uncle   ? ? ?SOCIAL HISTORY: ?Social History  ? ?Socioeconomic History  ? Marital status: Married  ?  Spouse name: Not on file  ? Number of children: 2  ? Years of education: Not on file  ? Highest education level: Not on file  ?Occupational History  ? Occupation: Waitress  ?  Employer: [employer]  ?Tobacco Use  ? Smoking status: Every Day  ?  Packs/day: 1.00  ?  Types: Cigarettes  ? Smokeless tobacco: Never  ?Substance and Sexual Activity  ? Alcohol use: No  ?  Alcohol/week: 0.0 standard drinks  ? Drug use: No  ? Sexual activity: Not on file  ?Other Topics Concern  ? Not on file  ?Social History Narrative  ? Pt does not get  regular exercise.  ? Lives with husband, 2 kids  ?   ? ?Social Determinants of Health  ? ?Financial Resource Strain: Not on file  ?Food Insecurity: Not on file  ?Transportation Needs: Not on file  ?Physical Activity: Not on file  ?Stress: Not on file  ?Social Connections: Not on file  ?Intimate Partner Violence: Not on file  ? ?PHYSICAL EXAM ? ?Vitals:  ? 10/16/21 0908  ?BP: 135/75  ?Pulse: 67  ?Weight: 147 lb 8 oz (66.9 kg)  ?Height: 5\' 4"  (1.626 m)  ? ? ?Body mass index is 25.32 kg/m?. ? ?Generalized: Well developed, in no acute distress  ? ?Neurological examination  ?Mentation: Alert  oriented to time, place, history taking. Follows all commands speech and language fluent ?Cranial nerve II-XII: Pupils were equal round reactive to light. Extraocular movements were full, visual field were full on confrontational test. Facial sensation and strength were normal. Head turning and shoulder shrug were normal and symmetric. Goiter to right neck noted. ?Motor: The motor testing reveals 5 over 5 strength of all 4 extremities. Good symmetric motor tone is noted throughout.  ?Sensory: Sensory testing is intact to soft touch on all 4 extremities. No evidence of extinction is noted.  ?Coordination: Cerebellar testing reveals good finger-nose-finger and heel-to-shin bilaterally.  ?Gait and station: Gait is normal. Tandem gait is normal. Romberg is normal.  ?Reflexes: Deep tendon reflexes are symmetric and normal bilaterally.  ? ?DIAGNOSTIC DATA (LABS, IMAGING, TESTING) ?- I reviewed patient records, labs, notes, testing and imaging myself where available. ? ?Lab Results  ?Component Value Date  ? WBC 5.1 10/12/2020  ? HGB 14.1 10/12/2020  ? HCT 39.3 10/12/2020  ? MCV 104 (H) 10/12/2020  ? PLT 262 10/12/2020  ? ?   ?Component Value Date/Time  ? NA 143 10/12/2020 0950  ? K 4.6 10/12/2020 0950  ? CL 106 10/12/2020 0950  ? CO2 22 10/12/2020 0950  ? GLUCOSE 79 10/12/2020 0950  ? GLUCOSE 78 05/28/2015 1028  ? BUN 8 10/12/2020 0950  ?  CREATININE 0.58 10/12/2020 0950  ? CALCIUM 9.3 10/12/2020 0950  ? PROT 6.6 10/12/2020 0950  ? ALBUMIN 4.1 10/12/2020 0950  ? AST 16 10/12/2020 0950  ? ALT 19 10/12/2020 0950  ? ALKPHOS 114 10/12/2020 0950  ? BILITOT 0.3

## 2021-10-16 NOTE — Patient Instructions (Addendum)
Continue current medication  ?Consider [facility]  ?Please see you primary care doctor for a physical  ?Call for any seizures ?See you back in 1 year  ?

## 2022-03-31 ENCOUNTER — Encounter: Payer: Self-pay | Admitting: Intensive Care

## 2022-03-31 ENCOUNTER — Emergency Department: Payer: Self-pay

## 2022-03-31 ENCOUNTER — Emergency Department
Admission: EM | Admit: 2022-03-31 | Discharge: 2022-03-31 | Disposition: A | Discharge status: Home / Self Care | Payer: Self-pay | Attending: Emergency Medicine | Admitting: Emergency Medicine

## 2022-03-31 ENCOUNTER — Other Ambulatory Visit: Payer: Self-pay

## 2022-03-31 DIAGNOSIS — K529 Noninfective gastroenteritis and colitis, unspecified: Secondary | ICD-10-CM | POA: Insufficient documentation

## 2022-03-31 LAB — URINALYSIS, ROUTINE W REFLEX MICROSCOPIC
Glucose, UA: NEGATIVE mg/dL
Ketones, ur: NEGATIVE mg/dL
Leukocytes,Ua: NEGATIVE
Nitrite: NEGATIVE
Protein, ur: 30 mg/dL — AB
Specific Gravity, Urine: 1.025 (ref 1.005–1.030)
pH: 5.5 (ref 5.0–8.0)

## 2022-03-31 LAB — CBC WITH DIFFERENTIAL/PLATELET
Abs Immature Granulocytes: 0.04 10*3/uL (ref 0.00–0.07)
Basophils Absolute: 0.1 10*3/uL (ref 0.0–0.1)
Basophils Relative: 1 %
Eosinophils Absolute: 0.2 10*3/uL (ref 0.0–0.5)
Eosinophils Relative: 2 %
HCT: 41.3 % (ref 36.0–46.0)
Hemoglobin: 14.4 g/dL (ref 12.0–15.0)
Immature Granulocytes: 0 %
Lymphocytes Relative: 19 %
Lymphs Abs: 2 10*3/uL (ref 0.7–4.0)
MCH: 35.9 pg — ABNORMAL HIGH (ref 26.0–34.0)
MCHC: 34.9 g/dL (ref 30.0–36.0)
MCV: 103 fL — ABNORMAL HIGH (ref 80.0–100.0)
Monocytes Absolute: 0.6 10*3/uL (ref 0.1–1.0)
Monocytes Relative: 6 %
Neutro Abs: 7.9 10*3/uL — ABNORMAL HIGH (ref 1.7–7.7)
Neutrophils Relative %: 72 %
Platelets: 285 10*3/uL (ref 150–400)
RBC: 4.01 MIL/uL (ref 3.87–5.11)
RDW: 11 % — ABNORMAL LOW (ref 11.5–15.5)
WBC: 10.8 10*3/uL — ABNORMAL HIGH (ref 4.0–10.5)
nRBC: 0 % (ref 0.0–0.2)

## 2022-03-31 LAB — URINALYSIS, MICROSCOPIC (REFLEX)
Bacteria, UA: NONE SEEN
RBC / HPF: >50 RBC/hpf (ref 0–5)

## 2022-03-31 LAB — COMPREHENSIVE METABOLIC PANEL
ALT: 15 U/L (ref 0–44)
AST: 29 U/L (ref 15–41)
Albumin: 3.7 g/dL (ref 3.5–5.0)
Alkaline Phosphatase: 105 U/L (ref 38–126)
Anion gap: 6 (ref 5–15)
BUN: 7 mg/dL (ref 6–20)
CO2: 22 mmol/L (ref 22–32)
Calcium: 8.6 mg/dL — ABNORMAL LOW (ref 8.9–10.3)
Chloride: 109 mmol/L (ref 98–111)
Creatinine, Ser: 0.61 mg/dL (ref 0.44–1.00)
GFR, Estimated: >60 mL/min (ref >60)
Glucose, Bld: 98 mg/dL (ref 70–99)
Potassium: 4.1 mmol/L (ref 3.5–5.1)
Sodium: 137 mmol/L (ref 135–145)
Total Bilirubin: 1.3 mg/dL — ABNORMAL HIGH (ref 0.3–1.2)
Total Protein: 7.1 g/dL (ref 6.5–8.1)

## 2022-03-31 MED ORDER — CIPROFLOXACIN HCL 500 MG PO TABS: 500.0000 mg | ORAL_TABLET | Freq: Two times a day (BID) | ORAL | 0 refills | Status: AC

## 2022-03-31 MED ORDER — DICYCLOMINE HCL 10 MG PO CAPS
10.0000 mg | ORAL_CAPSULE | Freq: Once | ORAL | Status: AC
  Administered 2022-03-31: 10 mg via ORAL
  Filled 2022-03-31: qty 1

## 2022-03-31 NOTE — Discharge Instructions (Addendum)
Take the antibiotic as prescribed and finish the full 5-day course.  Follow-up with your regular doctor.  Return to the ER for new, worsening, or persistent severe abdominal pain, blood in the stool, nausea or vomiting, fever, or any other new or worsening symptoms that concern you.

## 2022-03-31 NOTE — ED Triage Notes (Signed)
Patient c/o abdominal pain, diarrhea and rectal bleeding. She believes it started Friday night. Reports it is runny and only bright red blood. Abdominal pain has eased off some. Some nausea

## 2022-03-31 NOTE — ED Provider Notes (Signed)
[facility] Provider Note    Event Date/Time   First MD Initiated Contact with Patient 03/31/22 1213     (approximate)   History   Rectal Bleeding, Abdominal Pain, and Diarrhea   HPI  Caitlyn Gibson is a 41 y.o. female with a history of seizure disorder and anemia who presents with abdominal pain over the last 2 days, mainly in the lower abdomen, initially constant and now more episodic, and associated with nausea and one episode of vomiting.  The patient has also been having diarrhea which was initially watery but last night and today she noted bright red blood in the stool.  She denies any fever or chills.  She has not eaten anything out of the ordinary or traveled.  She denies sick contacts.  She has not been on antibiotics.      Physical Exam   Triage Vital Signs: ED Triage Vitals [03/31/22 1143]  Enc Vitals Group     BP (!) 141/98     Pulse Rate 83     Resp 16     Temp 98.8 F (37.1 C)     Temp Source Oral     SpO2 94 %     Weight 143 lb (64.9 kg)     Height 5\' 4"  (1.626 m)     Head Circumference      Peak Flow      Pain Score 4     Pain Loc      Pain Edu?      Excl. in [place]?     Most recent vital signs: Vitals:   03/31/22 1500 03/31/22 1530  BP: 111/79   Pulse: 68 68  Resp: 16   Temp: 98.6 F (37 C)   SpO2: 99% 99%    General: Awake, no distress.  CV:  Good peripheral perfusion.  Resp:  Normal effort.  Abd:  Soft with mild bilateral lower quadrant tenderness.  No distention.  Other:  No jaundice or scleral icterus.   ED Results / Procedures / Treatments   Labs (all labs ordered are listed, but only abnormal results are displayed) Labs Reviewed  CBC WITH DIFFERENTIAL/PLATELET - Abnormal; Notable for the following components:      Result Value   WBC 10.8 (*)    MCV 103.0 (*)    MCH 35.9 (*)    RDW 11.0 (*)    Neutro Abs 7.9 (*)    All other components within normal limits  COMPREHENSIVE METABOLIC PANEL - Abnormal;  Notable for the following components:   Calcium 8.6 (*)    Total Bilirubin 1.3 (*)    All other components within normal limits  URINALYSIS, ROUTINE W REFLEX MICROSCOPIC - Abnormal; Notable for the following components:   Hgb urine dipstick LARGE (*)    Bilirubin Urine SMALL (*)    Protein, ur 30 (*)    All other components within normal limits  URINALYSIS, MICROSCOPIC (REFLEX)  TYPE AND SCREEN  TYPE AND SCREEN     EKG     RADIOLOGY  CT abdomen/pelvis: I independently viewed and interpreted the images; there is no free fluid or free air or any significantly dilated bowel loops.  Radiology report indicates likely descending colitis with no evidence of diverticulitis.  PROCEDURES:  Critical Care performed: No  Procedures   MEDICATIONS ORDERED IN ED: Medications  dicyclomine (BENTYL) capsule 10 mg (10 mg Oral Given 03/31/22 1250)     IMPRESSION / MDM / ASSESSMENT AND PLAN /  ED COURSE  I reviewed the triage vital signs and the nursing notes.  The patient presents with lower abdominal pain and diarrhea over the last few days initially with vomiting and now with some blood in the stool.  She has mild bilateral lower quadrant tenderness but appears well and vital signs are normal.  The patient declines rectal exam.  Differential diagnosis includes, but is not limited to, acute infectious diarrhea, colitis, diverticulitis, diverticulosis.  We will obtain lab work-up and CT for further evaluation.  Patient's presentation is most consistent with acute complicated illness / injury requiring diagnostic workup.  ----------------------------------------- 4:00 PM on 03/31/2022 -----------------------------------------  Lab work-up is overall reassuring.  There is mild leukocytosis.  Electrolytes are unremarkable.  Urinalysis shows hemoglobin although this is likely from vaginal bleeding as the patient is also on her period.  CT shows likely descending colitis with no other acute  findings.  There is no evidence of diverticulitis.  We will treat empirically for infectious diarrhea given the blood although IBD is in the differential.  The patient feels well and would like to go home.  I counseled her on the results of the work-up and plan of care.  I prescribed a 5-day course of Cipro.  Return precautions given, and she expresses understanding.   FINAL CLINICAL IMPRESSION(S) / ED DIAGNOSES   Final diagnoses:  Colitis     Rx / DC Orders   ED Discharge Orders          Ordered    ciprofloxacin (CIPRO) 500 MG tablet  2 times daily        03/31/22 1523             Note:  This document was prepared using Dragon voice recognition software and may include unintentional dictation errors.    Arta Silence, MD 03/31/22 [phone]

## 2022-10-22 ENCOUNTER — Ambulatory Visit: Payer: Self-pay | Admitting: Neurology

## 2022-12-16 ENCOUNTER — Other Ambulatory Visit: Payer: Self-pay | Admitting: Neurology

## 2023-04-15 ENCOUNTER — Encounter: Payer: Self-pay | Admitting: Neurology

## 2023-04-15 ENCOUNTER — Ambulatory Visit (INDEPENDENT_AMBULATORY_CARE_PROVIDER_SITE_OTHER): Payer: Self-pay | Admitting: Neurology

## 2023-04-15 VITALS — BP 125/70 | Ht 64.0 in | Wt 151.0 lb

## 2023-04-15 DIAGNOSIS — G40909 Epilepsy, unspecified, not intractable, without status epilepticus: Secondary | ICD-10-CM

## 2023-04-15 MED ORDER — CARBAMAZEPINE 200 MG PO TABS: 400.0000 mg | ORAL_TABLET | Freq: Two times a day (BID) | ORAL | 4 refills | Status: DC

## 2023-04-15 NOTE — Patient Instructions (Signed)
Recommend establishing with a primary care doctor, follow up with endocrinology. Check labs today. Continue carbamazepine for seizure prevention. See you back in 1 year

## 2023-04-15 NOTE — Progress Notes (Signed)
Patient: Caitlyn Gibson Date of birth: 1981/04/30  Reason for visit:  follow up for seizures History from: Patient Primary neurologist: Dr. Terrace Gibson  ASSESSMENT AND PLAN 42 y.o. year old female  has a past medical history of Epilepsy (HCC) and Thyroid mass. here with:  1.  Complex partial seizure  -Doing well, continue carbamazepine 200 mg, 2 tablets twice daily -Check routine labs today, she does not have health insurance, or primary care doctor, she asked me to update TSH panel, B12, vitamin D -Encouraged to follow-up with endocrinology with history of goiter -Call for any issues, we will see back in 1 year  Orders Placed This Encounter  Procedures   CBC with Differential/Platelet   CMP   Carbamazepine level, total   Vitamin B12   Vitamin D, 25-hydroxy   Thyroid Panel With TSH   HISTORY Her seizure began in December of 1997.  She was first seen at age 60, 06/09/1996. She was the product of an 8 month pregnancy complicated by amniocentesis at 4 months of gestation. A chromosome analysis was unremarkable. At 6-1/2 months gestation there  was leaking from the placenta and she was born in hospital at  8 months gestation with a vertex presentation. She was early in motor developmental milestones. She did well in school, making A's and B's and never failed a grade. Her first seizure was a generalized major motor seizure 06/09/1996.   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 EEG 06/21/1996 showed  left temporal sharp waves with phase reversals at the T3 electrode. She was placed on Tegretol was started after a second generalized major motor seizure 06/22/1996 .She has not had a generalized  major motor seizure since 1997.   Her last MRI study of the brain 07/12/2003 with and without contrast enhancement was normal.  She had two successful pregnancies on carbamazepine or Tegretol. She had a tubal ligation  09/2004. I have asked her to take vitamin D and calcium but she never has. She has never had a DEXA scan because of costs. She has no insurance.     She has auras of seizures and simple partial seizures that occured once per month. She says that her head feels "crazy" and that she has an "empty" feeling in her head. She hears a ticking sound that  goes away in 2 minutes. There is no dj vu, strange odors,or tastes. She is on carbamazepine 200 mg tablets, 2 twice daily and 100 mg at noon. Last level was 8.3 She still has those aura 1-2 times q 6 months. She had a thyroid mass on exam and was referred to Dr. Clarene Gibson.She underwent a thyroid ultrasound  that showed bilateral calcifications and left thyroid nodule biopsy, showing a goiter. She was to see Dr. Romero Gibson,  She is on hold of her thyroid disease evaluation because of financial reasons   UPDATE June 4th 2018: Last visit was in February 2017, she had 2 generalized seizure at age 24, now taking her generic Tegretol 200 mg 2 tablets twice a day, she still has intermittent aura, described as tinkling sound in her head, lasting for less than 1 minute, no passing out, no generalized seizure activity, it happened every  few months, usually clustered her around her menstruation period of time  Update October 12, 2019 SS: Has not been seen since June 2018.  At that time she was switched off Tegretol due to cost, changed to Trileptal 300 mg twice a day.  She could tolerate the switch due to side effect, went back to Tegretol.  She is currently taking Tegretol 200 mg, 2 tablets twice daily.  Tolerating well.  She may have an occasional aura around her menstrual cycle, like a ticking sound in her head.  She has not had overt seizure.  She has history  of goiter.  She has not been seen by PCP in a while.  She has been working a lot lately, is Insurance claims handler.  Update October 12, 2020 SS: Last visit in April 2021, random carbamazepine level was elevated 13.0, TSH was normal 1.060.  Remains on Tegretol 200 mg, 2 tablets twice daily.  Denies any seizures since last seen.  May have occasional aura around her menstrual cycle, ticking noise she hears.  Continues to work at [employer], works a lot of hours.  Does not have any health insurance.  Is not established with PCP.  Has a goiter.  Update October 16, 2021 SS: Last visit in April 2022 carbamazepine level was 11.9, CMP was unremarkable, CBC showed some macrocytic changes, Hgb was 14.1. no seizures, around time of period, can get ticking noise 1 time for a few minutes, goes away. Doesn't have any insurance, carbamazepine is $70 every 3 months. Has goiter, feels getting bigger, knows needs to see endocrinology Caitlyn Gibson in the past.   Update April 15, 2023 SS: No seizures, remains on carbamazepine. Was in the ER Sept 2023 had colitis. Has not followed up with endocrinology. Is trying to get on her husbands insurance. She does not have routine maintenance care. Asked me to check her B12 and Vitamin D. Last generalized seizure was as a teen. Rarely gets aura around her menstrual cycle, described as ticking sound in her head with headache, 3 times this year.   REVIEW OF SYSTEMS: Out of a complete 14 system review of symptoms, the patient complains only of the following symptoms, and Gibson other reviewed systems are negative.  See HPI  ALLERGIES: Allergies  Allergen Reactions   Azithromycin    Erythromycin     Cannot take with tegretol    HOME MEDICATIONS: Outpatient Medications Prior to Visit  Medication Sig Dispense Refill   carbamazepine (TEGRETOL) 200 MG tablet TAKE TWO TABLETS BY MOUTH TWICE A DAY 360 tablet 4   No facility-administered medications prior to visit.    PAST MEDICAL  HISTORY: Past Medical History:  Diagnosis Date   Epilepsy (HCC)    Thyroid mass     PAST SURGICAL HISTORY: Past Surgical History:  Procedure Laterality Date   APPENDECTOMY  1998   rotator cuff tear Left 05/2014   ongoing for year before   TUBAL LIGATION      FAMILY HISTORY: Family History  Problem Relation Age of Onset   Cancer Other    Cancer Other        Ovarian Cancer-Grandmother   Diabetes Maternal Uncle     SOCIAL HISTORY: Social History   Socioeconomic History   Marital status: Married    Spouse name: Not on file   Number of children: 2   Years of education: Not on file   Highest education level: Not on file  Occupational History   Occupation: Child psychotherapist  Employer: [employer]  Tobacco Use   Smoking status: Every Day    Current packs/day: 1.00    Types: Cigarettes   Smokeless tobacco: Never  Vaping Use   Vaping status: Some Days  Substance and Sexual Activity   Alcohol use: Not Currently    Alcohol/week: 3.0 standard drinks of alcohol    Types: 3 Glasses of wine per week   Drug use: No   Sexual activity: Not on file  Other Topics Concern   Not on file  Social History Narrative   Pt does not get regular exercise.   Lives with husband, 2 kids   Right handed         Social Determinants of Health   Financial Resource Strain: Not on file  Food Insecurity: Not on file  Transportation Needs: Not on file  Physical Activity: Not on file  Stress: Not on file  Social Connections: Not on file  Intimate Partner Violence: Not on file   PHYSICAL EXAM  Vitals:   04/15/23 1336  BP: 125/70  Weight: 151 lb (68.5 kg)  Height: 5\' 4"  (1.626 m)   Body mass index is 25.92 kg/m.  Generalized: Well developed, in no acute distress   Neurological examination  Mentation: Alert oriented to time, place, history taking. Follows Gibson commands speech and language fluent Cranial nerve II-XII: Pupils were equal round reactive to light. Extraocular movements were full,  visual field were full on confrontational test. Facial sensation and strength were normal. Head turning and shoulder shrug were normal and symmetric. Goiter to right neck noted. Motor: The motor testing reveals 5 over 5 strength of Gibson 4 extremities. Good symmetric motor tone is noted throughout.  Sensory: Sensory testing is intact to soft touch on Gibson 4 extremities. No evidence of extinction is noted.  Coordination: Cerebellar testing reveals good finger-nose-finger and heel-to-shin bilaterally.  Gait and station: Gait is normal.  Reflexes: Deep tendon reflexes are symmetric and normal bilaterally.   DIAGNOSTIC DATA (LABS, IMAGING, TESTING) - I reviewed patient records, labs, notes, testing and imaging myself where available.  Lab Results  Component Value Date   WBC 10.8 (H) 03/31/2022   HGB 14.4 03/31/2022   HCT 41.3 03/31/2022   MCV 103.0 (H) 03/31/2022   PLT 285 03/31/2022      Component Value Date/Time   NA 137 03/31/2022 1150   NA 143 10/12/2020 0950   K 4.1 03/31/2022 1150   CL 109 03/31/2022 1150   CO2 22 03/31/2022 1150   GLUCOSE 98 03/31/2022 1150   BUN 7 03/31/2022 1150   BUN 8 10/12/2020 0950   CREATININE 0.61 03/31/2022 1150   CALCIUM 8.6 (L) 03/31/2022 1150   PROT 7.1 03/31/2022 1150   PROT 6.6 10/12/2020 0950   ALBUMIN 3.7 03/31/2022 1150   ALBUMIN 4.1 10/12/2020 0950   AST 29 03/31/2022 1150   ALT 15 03/31/2022 1150   ALKPHOS 105 03/31/2022 1150   BILITOT 1.3 (H) 03/31/2022 1150   BILITOT 0.3 10/12/2020 0950   GFRNONAA >60 03/31/2022 1150   GFRAA 145 10/12/2019 1447   No results found for: "CHOL", "HDL", "LDLCALC", "LDLDIRECT", "TRIG", "CHOLHDL" No results found for: "HGBA1C" No results found for: "VITAMINB12" Lab Results  Component Value Date   TSH 1.060 10/12/2019   Margie Ege, AGNP-C, DNP 04/15/2023, 2:04 PM [facility] [address], [address] [place], [place] [address] [phone]

## 2023-04-16 ENCOUNTER — Telehealth: Payer: Self-pay | Admitting: Neurology

## 2023-04-16 LAB — CARBAMAZEPINE LEVEL, TOTAL: Carbamazepine (Tegretol), S: 11.8 ug/mL (ref 4.0–12.0)

## 2023-04-16 LAB — COMPREHENSIVE METABOLIC PANEL
ALT: 20 [IU]/L (ref 0–32)
AST: 22 [IU]/L (ref 0–40)
Albumin: 4.4 g/dL (ref 3.9–4.9)
Alkaline Phosphatase: 119 [IU]/L (ref 44–121)
BUN/Creatinine Ratio: 11 (ref 9–23)
BUN: 6 mg/dL (ref 6–24)
Bilirubin Total: 0.3 mg/dL (ref 0.0–1.2)
CO2: 23 mmol/L (ref 20–29)
Calcium: 9.2 mg/dL (ref 8.7–10.2)
Chloride: 103 mmol/L (ref 96–106)
Creatinine, Ser: 0.54 mg/dL — ABNORMAL LOW (ref 0.57–1.00)
Globulin, Total: 2.4 g/dL (ref 1.5–4.5)
Glucose: 82 mg/dL (ref 70–99)
Potassium: 4.2 mmol/L (ref 3.5–5.2)
Sodium: 140 mmol/L (ref 134–144)
Total Protein: 6.8 g/dL (ref 6.0–8.5)
eGFR: 119 mL/min/{1.73_m2} (ref >59)

## 2023-04-16 LAB — CBC WITH DIFFERENTIAL/PLATELET
Basophils Absolute: 0.1 10*3/uL (ref 0.0–0.2)
Basos: 1 %
EOS (ABSOLUTE): 0.2 10*3/uL (ref 0.0–0.4)
Eos: 3 %
Hematocrit: 40.5 % (ref 34.0–46.6)
Hemoglobin: 13.9 g/dL (ref 11.1–15.9)
Immature Grans (Abs): 0 10*3/uL (ref 0.0–0.1)
Immature Granulocytes: 1 %
Lymphocytes Absolute: 1.6 10*3/uL (ref 0.7–3.1)
Lymphs: 28 %
MCH: 35.2 pg — ABNORMAL HIGH (ref 26.6–33.0)
MCHC: 34.3 g/dL (ref 31.5–35.7)
MCV: 103 fL — ABNORMAL HIGH (ref 79–97)
Monocytes Absolute: 0.4 10*3/uL (ref 0.1–0.9)
Monocytes: 7 %
Neutrophils Absolute: 3.5 10*3/uL (ref 1.4–7.0)
Neutrophils: 60 %
Platelets: 328 10*3/uL (ref 150–450)
RBC: 3.95 x10E6/uL (ref 3.77–5.28)
RDW: 10.6 % — ABNORMAL LOW (ref 11.7–15.4)
WBC: 5.8 10*3/uL (ref 3.4–10.8)

## 2023-04-16 LAB — THYROID PANEL WITH TSH
Free Thyroxine Index: 1.5 (ref 1.2–4.9)
T3 Uptake Ratio: 23 % — ABNORMAL LOW (ref 24–39)
T4, Total: 6.6 ug/dL (ref 4.5–12.0)
TSH: 0.803 u[IU]/mL (ref 0.450–4.500)

## 2023-04-16 LAB — VITAMIN D 25 HYDROXY (VIT D DEFICIENCY, FRACTURES): Vit D, 25-Hydroxy: 21.6 ng/mL — ABNORMAL LOW (ref 30.0–100.0)

## 2023-04-16 LAB — VITAMIN B12: Vitamin B-12: 268 pg/mL (ref 232–1245)

## 2023-04-16 NOTE — Telephone Encounter (Signed)
Please call, labs show continued mild variations in RBC in the setting of normal hemoglobin.  B12 is low normal range at 268 (normal [phone]), she may try OTC B 12 1000 mcg daily for reported fatigue. Vitamin D is mildly low at 21.6, recommend 1000 units daily OTC.  Thyroid panel did not show any significant abnormalities.  I would recommend again the importance of establishing with a PCP for follow-up on the above.  Does need to follow-up with endocrinology. If she starts OTC medications as above will need labs checked in few months with PCP. Thanks

## 2023-04-16 NOTE — Telephone Encounter (Signed)
Called and relayed to patient, she was agreeable to plan. Pt had no questions at this time but was encouraged to call back if questions arise. Pt verbalized understanding.

## 2023-04-17 ENCOUNTER — Encounter: Payer: Self-pay | Admitting: Neurology

## 2023-04-17 DIAGNOSIS — G40909 Epilepsy, unspecified, not intractable, without status epilepticus: Secondary | ICD-10-CM

## 2023-04-17 DIAGNOSIS — E042 Nontoxic multinodular goiter: Secondary | ICD-10-CM

## 2023-04-22 ENCOUNTER — Telehealth: Payer: Self-pay | Admitting: Neurology

## 2023-04-22 NOTE — Telephone Encounter (Signed)
Referral for endocrinology fax to [facility] to see Dr. Ocie Cornfield. Phone: [phone], Fax: [phone]

## 2023-05-09 ENCOUNTER — Other Ambulatory Visit: Payer: Self-pay | Admitting: Internal Medicine

## 2023-05-09 DIAGNOSIS — R1310 Dysphagia, unspecified: Secondary | ICD-10-CM

## 2023-05-09 DIAGNOSIS — E042 Nontoxic multinodular goiter: Secondary | ICD-10-CM

## 2023-05-23 ENCOUNTER — Other Ambulatory Visit: Payer: Self-pay

## 2023-05-26 ENCOUNTER — Ambulatory Visit
Admission: RE | Admit: 2023-05-26 | Discharge: 2023-05-26 | Disposition: A | Discharge status: Home / Self Care | Payer: No Typology Code available for payment source | Source: Ambulatory Visit | Attending: Internal Medicine | Admitting: Internal Medicine

## 2023-05-26 DIAGNOSIS — E042 Nontoxic multinodular goiter: Secondary | ICD-10-CM

## 2023-05-26 DIAGNOSIS — R1310 Dysphagia, unspecified: Secondary | ICD-10-CM

## 2024-04-15 ENCOUNTER — Ambulatory Visit: Payer: Self-pay | Admitting: Neurology

## 2024-04-20 ENCOUNTER — Ambulatory Visit (INDEPENDENT_AMBULATORY_CARE_PROVIDER_SITE_OTHER): Payer: Self-pay | Admitting: Neurology

## 2024-04-20 ENCOUNTER — Encounter: Payer: Self-pay | Admitting: Neurology

## 2024-04-20 VITALS — BP 128/82 | HR 67 | Resp 16 | Ht 64.0 in | Wt 148.5 lb

## 2024-04-20 DIAGNOSIS — G40909 Epilepsy, unspecified, not intractable, without status epilepticus: Secondary | ICD-10-CM

## 2024-04-20 MED ORDER — CARBAMAZEPINE 200 MG PO TABS: 400.0000 mg | ORAL_TABLET | Freq: Two times a day (BID) | ORAL | 4 refills | Status: AC

## 2024-04-20 NOTE — Patient Instructions (Signed)
 Please establish with primary care.  Follow-up with endocrinology to have repeat blood testing done.  Continue current dose of carbamazepine .  Please call for seizure activity.  Follow-up in 1 year.  Thanks

## 2024-04-20 NOTE — Progress Notes (Signed)
 Patient: Caitlyn Gibson Date of birth: 06/28/1981  Reason for visit:  follow up for seizures History from: Patient Primary neurologist: Dr. Onita  ASSESSMENT AND PLAN 43 y.o. year old female  has a past medical history of Epilepsy (HCC) and Thyroid  mass. here with:  1.  Complex partial seizure  -Doing well, continue carbamazepine  200 mg, 2 tablets twice daily -Checked labs year, does not have insurance, needs to follow up with endocrinology for further laboratory evaluation. Will hold off on labs this year due to cost, but is on B12, vitamin D  supplement. -Recommend establishing with primary care -Call for any issues, follow up in 1 year  HISTORY Her seizure began in December of 1997.  She was first seen at age 36, 06/09/1996. She was the product of an 8 month pregnancy complicated by amniocentesis at 4 months of gestation. A chromosome analysis was unremarkable. At 6-1/2 months gestation there  was leaking from the placenta and she was born in hospital at  8 months gestation with a vertex presentation. She was early in motor developmental milestones. She did well in school, making A's and B's and never failed a grade. Her first seizure was a generalized major motor seizure 06/09/1996.   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 EEG 06/21/1996 showed  left temporal sharp waves with phase reversals at the T3 electrode. She was placed on Tegretol  was started after a second generalized major motor seizure 06/22/1996 .She has not had a generalized major motor seizure since 1997.   Her last MRI study of the brain 07/12/2003 with and without contrast enhancement was normal.  She had two successful  pregnancies on carbamazepine  or Tegretol . She had a tubal ligation  09/2004. I have asked her to take vitamin D  and calcium but she never has. She has never had a DEXA scan because of costs. She has no insurance.     She has auras of seizures and simple partial seizures that occured once per month. She says that her head feels crazy and that she has an empty feeling in her head. She hears a ticking sound that  goes away in 2 minutes. There is no dj vu, strange odors,or tastes. She is on carbamazepine  200 mg tablets, 2 twice daily and 100 mg at noon. Last level was 8.3 She still has those aura 1-2 times q 6 months. She had a thyroid  mass on exam and was referred to Dr. Morgan.She underwent a thyroid  ultrasound  that showed bilateral calcifications and left thyroid  nodule biopsy, showing a goiter. She was to see Dr. Alyce Staff,  She is on hold of her thyroid  disease evaluation because of financial reasons   UPDATE June 4th 2018: Last visit was in February 2017, she had 2 generalized seizure at age 63, now taking her generic Tegretol  200 mg 2 tablets twice a day, she still has intermittent aura, described as tinkling sound in her head, lasting for less than 1 minute, no passing out, no generalized seizure activity, it happened every few months, usually clustered her around her menstruation period of time  Update October 12, 2019 SS: Has not been seen since June 2018.  At that time she was switched off  Tegretol  due to cost, changed to Trileptal  300 mg twice a day.  She could tolerate the switch due to side effect, went back to Tegretol .  She is currently taking Tegretol  200 mg, 2 tablets twice daily.  Tolerating well.  She may have an occasional aura around her menstrual cycle, like a ticking sound in her head.  She has not had overt seizure.  She has history of goiter.  She has not been seen by PCP in a while.  She has been working a lot lately, is Insurance claims handler.  Update October 12, 2020 SS: Last  visit in April 2021, random carbamazepine  level was elevated 13.0, TSH was normal 1.060.  Remains on Tegretol  200 mg, 2 tablets twice daily.  Denies any seizures since last seen.  May have occasional aura around her menstrual cycle, ticking noise she hears.  Continues to work at [employer], works a lot of hours.  Does not have any health insurance.  Is not established with PCP.  Has a goiter.  Update October 16, 2021 SS: Last visit in April 2022 carbamazepine  level was 11.9, CMP was unremarkable, CBC showed some macrocytic changes, Hgb was 14.1. no seizures, around time of period, can get ticking noise 1 time for a few minutes, goes away. Doesn't have any insurance, carbamazepine  is $70 every 3 months. Has goiter, feels getting bigger, knows needs to see endocrinology Dr. Kassie in the past.   Update April 15, 2023 SS: No seizures, remains on carbamazepine . Was in the ER Sept 2023 had colitis. Has not followed up with endocrinology. Is trying to get on her husbands insurance. She does not have routine maintenance care. Asked me to check her B12 and Vitamin D . Last generalized seizure was as a teen. Rarely gets aura around her menstrual cycle, described as ticking sound in her head with headache, 3 times this year.   Update April 20, 2024 SS: Saw endocrinology last year, needed to have more labs. No seizures, remains on Carbamazepine  200 mg, 2 tablets 2 times daily. Last visit B12 268, Vit D 21.6. Has supplements. She has stopped smoking for 2 years. Labs cost $ 700 last year. Swallow study did not show any blockage of goiter.   REVIEW OF SYSTEMS: Out of a complete 14 system review of symptoms, the patient complains only of the following symptoms, and all other reviewed systems are negative.  See HPI  ALLERGIES: Allergies  Allergen Reactions   Azithromycin    Erythromycin     Cannot take with tegretol     HOME MEDICATIONS: Outpatient Medications Prior to Visit  Medication Sig Dispense Refill    carbamazepine  (TEGRETOL ) 200 MG tablet Take 2 tablets (400 mg total) by mouth 2 (two) times daily. 360 tablet 4   No facility-administered medications prior to visit.    PAST MEDICAL HISTORY: Past Medical History:  Diagnosis Date   Epilepsy (HCC)    Thyroid  mass     PAST SURGICAL HISTORY: Past Surgical History:  Procedure Laterality Date   APPENDECTOMY  1998   rotator cuff tear Left 05/2014   ongoing for year before   TUBAL LIGATION      FAMILY HISTORY: Family History  Problem Relation Age of Onset   Cancer Other    Cancer Other        Ovarian Cancer-Grandmother   Diabetes Maternal Uncle     SOCIAL HISTORY: Social History   Socioeconomic History   Marital status: Married    Spouse name: Not on  file   Number of children: 2   Years of education: Not on file   Highest education level: Not on file  Occupational History   Occupation: Waitress    Employer: [employer]  Tobacco Use   Smoking status: Former    Current packs/day: 1.00    Types: Cigarettes   Smokeless tobacco: Never  Vaping Use   Vaping status: Some Days  Substance and Sexual Activity   Alcohol use: Not Currently    Alcohol/week: 3.0 standard drinks of alcohol    Types: 3 Glasses of wine per week   Drug use: No   Sexual activity: Not on file  Other Topics Concern   Not on file  Social History Narrative   Pt does not get regular exercise.   Lives with husband, 2 kids   Right handed         Social Drivers of Corporate investment banker Strain: Not on file  Food Insecurity: Not on file  Transportation Needs: Not on file  Physical Activity: Not on file  Stress: Not on file  Social Connections: Not on file  Intimate Partner Violence: Not on file   PHYSICAL EXAM  Vitals:   04/20/24 1459  BP: 128/82  Pulse: 67  Resp: 16  SpO2: 98%  Weight: 148 lb 8 oz (67.4 kg)  Height: 5' 4 (1.626 m)    Body mass index is 25.49 kg/m.  Generalized: Well developed, in no acute distress    Neurological examination  Mentation: Alert oriented to time, place, history taking. Follows all commands speech and language fluent Cranial nerve II-XII: Pupils were equal round reactive to light. Extraocular movements were full, visual field were full on confrontational test. Facial sensation and strength were normal. Head turning and shoulder shrug were normal and symmetric. Goiter to right neck noted. Motor: The motor testing reveals 5 over 5 strength of all 4 extremities. Good symmetric motor tone is noted throughout.  Sensory: Sensory testing is intact to soft touch on all 4 extremities. No evidence of extinction is noted.  Coordination: Cerebellar testing reveals good finger-nose-finger and heel-to-shin bilaterally.  Gait and station: Gait is normal.  Reflexes: Deep tendon reflexes are symmetric and normal bilaterally.   DIAGNOSTIC DATA (LABS, IMAGING, TESTING) - I reviewed patient records, labs, notes, testing and imaging myself where available.  Lab Results  Component Value Date   WBC 5.8 04/15/2023   HGB 13.9 04/15/2023   HCT 40.5 04/15/2023   MCV 103 (H) 04/15/2023   PLT 328 04/15/2023      Component Value Date/Time   NA 140 04/15/2023 1407   K 4.2 04/15/2023 1407   CL 103 04/15/2023 1407   CO2 23 04/15/2023 1407   GLUCOSE 82 04/15/2023 1407   GLUCOSE 98 03/31/2022 1150   BUN 6 04/15/2023 1407   CREATININE 0.54 (L) 04/15/2023 1407   CALCIUM 9.2 04/15/2023 1407   PROT 6.8 04/15/2023 1407   ALBUMIN 4.4 04/15/2023 1407   AST 22 04/15/2023 1407   ALT 20 04/15/2023 1407   ALKPHOS 119 04/15/2023 1407   BILITOT 0.3 04/15/2023 1407   GFRNONAA >60 03/31/2022 1150   GFRAA 145 10/12/2019 1447   No results found for: CHOL, HDL, LDLCALC, LDLDIRECT, TRIG, CHOLHDL No results found for: [id number] Lab Results  Component Value Date   VITAMINB12 268 04/15/2023   Lab Results  Component Value Date   TSH 0.803 04/15/2023   Lauraine Born, AGNP-C, DNP 04/20/2024, 3:22  PM [facility] [address],  [address] [place], [place] [address] [phone]

## 2025-04-26 ENCOUNTER — Ambulatory Visit: Payer: Self-pay | Admitting: Neurology
